# Patient Record
Sex: Female | Born: 1970 | Race: Black or African American | Hispanic: No | Marital: Single | Smoking: Current every day smoker
Health system: Southern US, Community
[De-identification: ages and names within clinical notes are randomized; demographics above are authoritative.]

## PROBLEM LIST (undated history)

## (undated) DIAGNOSIS — J40 Bronchitis, not specified as acute or chronic: Secondary | ICD-10-CM

## (undated) DIAGNOSIS — I10 Essential (primary) hypertension: Secondary | ICD-10-CM

---

## 1999-06-01 ENCOUNTER — Emergency Department (HOSPITAL_COMMUNITY): Admission: EM | Admit: 1999-06-01 | Discharge: 1999-06-01 | Payer: Self-pay | Admitting: Emergency Medicine

## 1999-09-01 ENCOUNTER — Ambulatory Visit (HOSPITAL_COMMUNITY): Admission: RE | Admit: 1999-09-01 | Discharge: 1999-09-01 | Payer: Self-pay | Admitting: Internal Medicine

## 1999-09-01 ENCOUNTER — Encounter: Payer: Self-pay | Admitting: Internal Medicine

## 1999-10-27 ENCOUNTER — Emergency Department (HOSPITAL_COMMUNITY): Admission: EM | Admit: 1999-10-27 | Discharge: 1999-10-27 | Payer: Self-pay | Admitting: *Deleted

## 2004-08-08 ENCOUNTER — Emergency Department (HOSPITAL_COMMUNITY): Admission: EM | Admit: 2004-08-08 | Discharge: 2004-08-08 | Payer: Self-pay | Admitting: Emergency Medicine

## 2005-10-17 ENCOUNTER — Emergency Department (HOSPITAL_COMMUNITY): Admission: EM | Admit: 2005-10-17 | Discharge: 2005-10-17 | Payer: Self-pay | Admitting: Emergency Medicine

## 2007-12-08 ENCOUNTER — Emergency Department (HOSPITAL_COMMUNITY): Admission: EM | Admit: 2007-12-08 | Discharge: 2007-12-08 | Payer: Self-pay | Admitting: Emergency Medicine

## 2012-03-11 ENCOUNTER — Encounter (HOSPITAL_COMMUNITY): Payer: Self-pay | Admitting: Emergency Medicine

## 2012-03-11 ENCOUNTER — Emergency Department (HOSPITAL_COMMUNITY)
Admission: EM | Admit: 2012-03-11 | Discharge: 2012-03-11 | Disposition: A | Discharge status: Home / Self Care | Payer: Self-pay | Attending: Emergency Medicine | Admitting: Emergency Medicine

## 2012-03-11 DIAGNOSIS — I10 Essential (primary) hypertension: Secondary | ICD-10-CM | POA: Insufficient documentation

## 2012-03-11 DIAGNOSIS — F17200 Nicotine dependence, unspecified, uncomplicated: Secondary | ICD-10-CM | POA: Insufficient documentation

## 2012-03-11 DIAGNOSIS — T169XXA Foreign body in ear, unspecified ear, initial encounter: Secondary | ICD-10-CM | POA: Insufficient documentation

## 2012-03-11 DIAGNOSIS — H61899 Other specified disorders of external ear, unspecified ear: Secondary | ICD-10-CM

## 2012-03-11 DIAGNOSIS — IMO0002 Reserved for concepts with insufficient information to code with codable children: Secondary | ICD-10-CM | POA: Insufficient documentation

## 2012-03-11 HISTORY — DX: Essential (primary) hypertension: I10

## 2012-03-11 HISTORY — DX: Bronchitis, not specified as acute or chronic: J40

## 2012-03-11 MED ORDER — LIDOCAINE VISCOUS 2 % MT SOLN: 20.0000 mL | Freq: Once | OROMUCOSAL | Status: DC

## 2012-03-11 MED ORDER — LIDOCAINE HCL 4 % EX SOLN
Freq: Once | CUTANEOUS | Status: AC
  Administered 2012-03-11: 1 mL via TOPICAL
  Filled 2012-03-11 (×2): qty 50

## 2012-03-11 NOTE — ED Provider Notes (Signed)
History     CSN: [id number]  Arrival date & time 03/11/12  4098   First MD Initiated Contact with Patient 03/11/12 [phone]      Chief Complaint  Patient presents with  . Foreign Body in Ear    (Consider location/radiation/quality/duration/timing/severity/associated sxs/prior treatment) HPI. Patient presents to the emergency department with foreign body in the right ear.  Patient, states, feels, like a bug in her right ear.  Patient, states, that she woke up this morning, with that sensation.  Patient denies any decreased hearing or hearing loss.  Patient denies any drainage from the ear or fever.  Patient had attempt anything prior to arrival, for removal of foreign body. Past Medical History  Diagnosis Date  . Hypertension   . Bronchitis     History reviewed. No pertinent past surgical history.  No family history on file.  History  Substance Use Topics  . Smoking status: Current Everyday Smoker -- 0.2 packs/day  . Smokeless tobacco: Never Used  . Alcohol Use: No    OB History    Grav Para Term Preterm Abortions TAB SAB Ect Mult Living                  Review of Systems All other systems negative except as documented in the HPI. All pertinent positives and negatives as reviewed in the HPI.  Allergies  Review of patient's allergies indicates no known allergies.  Home Medications   Current Outpatient Rx  Name Route Sig Dispense Refill  . ASPIRIN-ACETAMINOPHEN-CAFFEINE 250-250-65 MG PO TABS Oral Take 2 tablets by mouth every 6 (six) hours as needed. migraine      BP 166/109  Pulse 92  Temp 98.7 F (37.1 C) (Oral)  SpO2 98%  LMP 02/21/2012  Physical Exam  Nursing note and vitals reviewed. Constitutional: She appears well-developed and well-nourished. No distress.  HENT:  Ears:    ED Course  Procedures (including critical care time)   Nurse rinsed the ear and I re-examined the patient and I can't see any bug or FB. The small piece of wax was removed. I  do not feel that any further work on this needs to occur at this time. The patient states that she does not feel the sensation that there is a bug in her ear any longer.  MDM          Carlyle Dolly, PA-C 03/11/12 1150

## 2012-03-11 NOTE — ED Provider Notes (Signed)
Medical screening examination/treatment/procedure(s) were performed by non-physician practitioner and as supervising physician I was immediately available for consultation/collaboration.  Marlen Koman, MD 03/11/12 1519 

## 2012-03-11 NOTE — ED Notes (Signed)
Pt presents w/ "bug in right ear" woke her up during the morning, can still fill it moving around inside. Pt w/ Hx of hypertension not on meds.

## 2013-09-25 ENCOUNTER — Encounter (HOSPITAL_COMMUNITY): Payer: Self-pay | Admitting: Emergency Medicine

## 2013-09-25 ENCOUNTER — Emergency Department (HOSPITAL_COMMUNITY)
Admission: EM | Admit: 2013-09-25 | Discharge: 2013-09-25 | Disposition: A | Discharge status: Home / Self Care | Payer: Self-pay | Attending: Emergency Medicine | Admitting: Emergency Medicine

## 2013-09-25 DIAGNOSIS — F17200 Nicotine dependence, unspecified, uncomplicated: Secondary | ICD-10-CM | POA: Insufficient documentation

## 2013-09-25 DIAGNOSIS — J209 Acute bronchitis, unspecified: Secondary | ICD-10-CM | POA: Insufficient documentation

## 2013-09-25 DIAGNOSIS — Z79899 Other long term (current) drug therapy: Secondary | ICD-10-CM | POA: Insufficient documentation

## 2013-09-25 DIAGNOSIS — J40 Bronchitis, not specified as acute or chronic: Secondary | ICD-10-CM

## 2013-09-25 DIAGNOSIS — I10 Essential (primary) hypertension: Secondary | ICD-10-CM | POA: Insufficient documentation

## 2013-09-25 MED ORDER — AZITHROMYCIN 250 MG PO TABS: 250.0000 mg | ORAL_TABLET | Freq: Every day | ORAL | Status: DC

## 2013-09-25 MED ORDER — HYDROCOD POLST-CHLORPHEN POLST 10-8 MG/5ML PO LQCR: 5.0000 mL | Freq: Two times a day (BID) | ORAL | Status: DC

## 2013-09-25 MED ORDER — ALBUTEROL SULFATE HFA 108 (90 BASE) MCG/ACT IN AERS: 1.0000 | INHALATION_SPRAY | Freq: Four times a day (QID) | RESPIRATORY_TRACT | Status: DC | PRN

## 2013-09-25 NOTE — ED Provider Notes (Addendum)
CSN: [id number]     Arrival date & time 09/25/13  [id number] History   First MD Initiated Contact with Patient 09/25/13 0703     Chief Complaint  Patient presents with  . Cough     HPI  Patient presents with a cough for last 5 days. She coughing the point that she cannot work. She starts to cough and has a staccato cough that goes over several minutes. She gags at times but has not had posttussive emesis. Is not around any ill exposures to her knowledge. Has not been febrile but does have diffuse bodyaches.  No GI complaints. She is a smoker. No history of asthma or lung disease.  Past Medical History  Diagnosis Date  . Hypertension   . Bronchitis    History reviewed. No pertinent past surgical history. History reviewed. No pertinent family history. History  Substance Use Topics  . Smoking status: Current Every Day Smoker -- 0.25 packs/day  . Smokeless tobacco: Never Used  . Alcohol Use: No   OB History   Grav Para Term Preterm Abortions TAB SAB Ect Mult Living                 Review of Systems  Constitutional: Negative for fever, chills, diaphoresis, appetite change and fatigue.  HENT: Negative for mouth sores, sore throat and trouble swallowing.   Eyes: Negative for visual disturbance.  Respiratory: Positive for cough and wheezing. Negative for chest tightness and shortness of breath.   Cardiovascular: Negative for chest pain.  Gastrointestinal: Negative for nausea, vomiting, abdominal pain, diarrhea and abdominal distention.  Endocrine: Negative for polydipsia, polyphagia and polyuria.  Genitourinary: Negative for dysuria, frequency and hematuria.  Musculoskeletal: Negative for gait problem.  Skin: Negative for color change, pallor and rash.  Neurological: Negative for dizziness, syncope, light-headedness and headaches.  Hematological: Does not bruise/bleed easily.  Psychiatric/Behavioral: Negative for behavioral problems and confusion.      Allergies  Review of  patient's allergies indicates no known allergies.  Home Medications   Current Outpatient Rx  Name  Route  Sig  Dispense  Refill  . albuterol (PROVENTIL HFA;VENTOLIN HFA) 108 (90 BASE) MCG/ACT inhaler   Inhalation   Inhale 1-2 puffs into the lungs every 6 (six) hours as needed for wheezing.   1 Inhaler   0   . aspirin-acetaminophen-caffeine (EXCEDRIN MIGRAINE) 250-250-65 MG per tablet   Oral   Take 2 tablets by mouth every 6 (six) hours as needed. migraine         . azithromycin (ZITHROMAX Z-PAK) 250 MG tablet   Oral   Take 1 tablet (250 mg total) by mouth daily. As directed   6 tablet   0   . chlorpheniramine-HYDROcodone (TUSSIONEX PENNKINETIC ER) 10-8 MG/5ML LQCR   Oral   Take 5 mLs by mouth every 12 (twelve) hours.   60 mL   0    BP 201/118  Pulse 107  Temp(Src) 98.4 F (36.9 C) (Oral)  Resp 18  SpO2 100% Physical Exam  Constitutional: She is oriented to person, place, and time. She appears well-developed and well-nourished. No distress.  HENT:  Head: Normocephalic.  Eyes: Conjunctivae are normal. Pupils are equal, round, and reactive to light. No scleral icterus.  Neck: Normal range of motion. Neck supple. No thyromegaly present.  Cardiovascular: Normal rate and regular rhythm.  Exam reveals no gallop and no friction rub.   No murmur heard. Pulmonary/Chest: Effort normal and breath sounds normal. No respiratory distress. She has no wheezes.  She has no rales.  No focal diminished breath sounds are asymmetric breath sounds. Wheezing with cough none at rest. No prolongation.  Abdominal: Soft. Bowel sounds are normal. She exhibits no distension. There is no tenderness. There is no rebound.  Musculoskeletal: Normal range of motion.  Neurological: She is alert and oriented to person, place, and time.  Skin: Skin is warm and dry. No rash noted.  Psychiatric: She has a normal mood and affect. Her behavior is normal.    ED Course  Procedures (including critical care  time) Labs Review Labs Reviewed - No data to display Imaging Review No results found.   EKG Interpretation None      MDM   Final diagnoses:  Bronchitis    Normal pulmonary exam. Does have bronchospasm with cough. Afebrile. 100% saturations at the bedside. Plan will be treated with albuterol, Zithromax, Tussionex. Stop smoking.    Rolland Porter, MD 09/25/13 4098  Rolland Porter, MD 09/25/13 [phone]

## 2013-09-25 NOTE — Discharge Instructions (Signed)
Bronchitis °Bronchitis is swelling (inflammation) of the air tubes leading to your lungs (bronchi). This causes mucus and a cough. If the swelling gets bad, you may have trouble breathing. °HOME CARE  °· Rest. °· Drink enough fluids to keep your pee (urine) clear or pale yellow (unless you have a condition where you have to watch how much you drink). °· Only take medicine as told by your doctor. If you were given antibiotic medicines, finish them even if you start to feel better. °· Avoid smoke, irritating chemicals, and strong smells. These make the problem worse. Quit smoking if you smoke. This helps your lungs heal faster. °· Use a cool mist humidifier. Change the water in the humidifier every day. You can also sit in the bathroom with hot shower running for 5 10 minutes. Keep the door closed. °· See your health care provider as told. °· Wash your hands often. °GET HELP IF: °Your problems do not get better after 1 week. °GET HELP RIGHT AWAY IF:  °· Your fever gets worse. °· You have chills. °· Your chest hurts. °· Your problems breathing get worse. °· You have blood in your mucus. °· You pass out (faint). °· You feel lightheaded. °· You have a bad headache. °· You throw up (vomit) again and again. °MAKE SURE YOU: °· Understand these instructions. °· Will watch your condition. °· Will get help right away if you are not doing well or get worse. °Document Released: 12/21/2007 Document Revised: 04/24/2013 Document Reviewed: 02/26/2013 °[employer]® Patient Information ©2014 [employer]. ° °

## 2013-09-25 NOTE — ED Notes (Signed)
Pt presents to ED with c/o persistent cough.  Pt reports that she has been having"whooping cough" x 1 week now--- pt reports taking Mucinex without relief.  Pt states that her cough is occasionally productive of "very little" phlegm.  Pt denies shortness of breath or any difficulty of breathing.

## 2014-09-12 ENCOUNTER — Emergency Department (HOSPITAL_COMMUNITY)
Admission: EM | Admit: 2014-09-12 | Discharge: 2014-09-13 | Disposition: A | Discharge status: Home / Self Care | Payer: BLUE CROSS/BLUE SHIELD | Attending: Emergency Medicine | Admitting: Emergency Medicine

## 2014-09-12 ENCOUNTER — Encounter (HOSPITAL_COMMUNITY): Payer: Self-pay | Admitting: Emergency Medicine

## 2014-09-12 DIAGNOSIS — R51 Headache: Secondary | ICD-10-CM | POA: Diagnosis not present

## 2014-09-12 DIAGNOSIS — E876 Hypokalemia: Secondary | ICD-10-CM | POA: Diagnosis not present

## 2014-09-12 DIAGNOSIS — I10 Essential (primary) hypertension: Secondary | ICD-10-CM | POA: Diagnosis present

## 2014-09-12 DIAGNOSIS — Z8709 Personal history of other diseases of the respiratory system: Secondary | ICD-10-CM | POA: Insufficient documentation

## 2014-09-12 DIAGNOSIS — Z720 Tobacco use: Secondary | ICD-10-CM | POA: Insufficient documentation

## 2014-09-12 DIAGNOSIS — Z79899 Other long term (current) drug therapy: Secondary | ICD-10-CM | POA: Insufficient documentation

## 2014-09-12 DIAGNOSIS — F17200 Nicotine dependence, unspecified, uncomplicated: Secondary | ICD-10-CM

## 2014-09-12 LAB — I-STAT CHEM 8, ED
BUN: 17 mg/dL (ref 6–23)
CALCIUM ION: 1.09 mmol/L — AB (ref 1.12–1.23)
CREATININE: 0.9 mg/dL (ref 0.50–1.10)
Chloride: 103 mmol/L (ref 96–112)
Glucose, Bld: 127 mg/dL — ABNORMAL HIGH (ref 70–99)
HCT: 45 % (ref 36.0–46.0)
Hemoglobin: 15.3 g/dL — ABNORMAL HIGH (ref 12.0–15.0)
Potassium: 3 mmol/L — ABNORMAL LOW (ref 3.5–5.1)
Sodium: 140 mmol/L (ref 135–145)
TCO2: 20 mmol/L (ref 0–100)

## 2014-09-12 LAB — I-STAT BETA HCG BLOOD, ED (MC, WL, AP ONLY)

## 2014-09-12 MED ORDER — POTASSIUM CHLORIDE CRYS ER 20 MEQ PO TBCR
40.0000 meq | EXTENDED_RELEASE_TABLET | Freq: Once | ORAL | Status: AC
  Administered 2014-09-12: 40 meq via ORAL
  Filled 2014-09-12: qty 2

## 2014-09-12 MED ORDER — LISINOPRIL-HYDROCHLOROTHIAZIDE 10-12.5 MG PO TABS: 1.0000 | ORAL_TABLET | Freq: Every day | ORAL | Status: DC

## 2014-09-12 NOTE — ED Notes (Signed)
Pt c/o HA and hypertension for several days. Pt states she has not been taking Lisinopril because she does not feel they were working

## 2014-09-12 NOTE — ED Provider Notes (Signed)
CSN: [id number]     Arrival date & time 09/12/14  2108 History   First MD Initiated Contact with Patient 09/12/14 2216     Chief Complaint  Patient presents with  . Hypertension  . Headache     (Consider location/radiation/quality/duration/timing/severity/associated sxs/prior Treatment) HPI  Diane Hunter is a 44 y.o. female complaining of elevated blood pressure which she noticed after checking it at [facility] earlier in the evening, systolic was 210, in the ED at 201. Patient also notes a right. Orbital and maxillary burning sensation which she has related to a sinusitis onset 3 days ago. She rates it as mild, 1 out of 10, leaning forward exacerbates. She denies fever, chills, change in vision, dysarthria, ataxia, cervicalgia, chest pain, shortness of breath, abdominal pain, change in bowel or bladder habits.  She states she self DC'd lisinopril 40 mg because she felt like it wasn't helping her, she has not had her high blood pressure medication in 4 weeks.  Past Medical History  Diagnosis Date  . Hypertension   . Bronchitis    History reviewed. No pertinent past surgical history. No family history on file. History  Substance Use Topics  . Smoking status: Current Every Day Smoker -- 0.25 packs/day  . Smokeless tobacco: Never Used  . Alcohol Use: No   OB History    No data available     Review of Systems  10 systems reviewed and found to be negative, except as noted in the HPI.   Allergies  Pork-derived products  Home Medications   Prior to Admission medications   Medication Sig Start Date End Date Taking? Authorizing Provider  aspirin-acetaminophen-caffeine (EXCEDRIN MIGRAINE) [phone] MG per tablet Take 2 tablets by mouth every 6 (six) hours as needed. migraine   Yes Historical Provider, MD  lisinopril (PRINIVIL,ZESTRIL) 10 MG tablet Take 10 mg by mouth daily.   Yes Historical Provider, MD  pseudoephedrine (SUDAFED) 30 MG tablet Take 30 mg by mouth every 4 (four) hours  as needed for congestion.   Yes Historical Provider, MD  albuterol (PROVENTIL HFA;VENTOLIN HFA) 108 (90 BASE) MCG/ACT inhaler Inhale 1-2 puffs into the lungs every 6 (six) hours as needed for wheezing. Patient not taking: Reported on 09/12/2014 09/25/13   Rolland Porter, MD  azithromycin (ZITHROMAX Z-PAK) 250 MG tablet Take 1 tablet (250 mg total) by mouth daily. As directed Patient not taking: Reported on 09/12/2014 09/25/13   Rolland Porter, MD  chlorpheniramine-HYDROcodone [facility]) 10-8 MG/5ML [facility] Take 5 mLs by mouth every 12 (twelve) hours. Patient not taking: Reported on 09/12/2014 09/25/13   Rolland Porter, MD  lisinopril-hydrochlorothiazide (PRINZIDE,ZESTORETIC) 10-12.5 MG per tablet Take 1 tablet by mouth daily. 09/12/14   Jariyah Hackley, PA-C   BP 171/109 mmHg  Pulse 74  Temp(Src) 98.3 F (36.8 C) (Oral)  Resp 20  SpO2 100%  LMP 09/03/2014 Physical Exam  Constitutional: She is oriented to person, place, and time. She appears well-developed and well-nourished. No distress.  HENT:  Head: Normocephalic and atraumatic.  Mouth/Throat: Oropharynx is clear and moist.  No drooling or stridor. Posterior pharynx mildly erythematous no significant tonsillar hypertrophy. No exudate. Soft palate rises symmetrically. No TTP or induration under tongue.   No tenderness to palpation of frontal or bilateral maxillary sinuses.  No mucosal edema in the nares.  Bilateral tympanic membranes with normal architecture and good light reflex.    Eyes: Conjunctivae and EOM are normal. Pupils are equal, round, and reactive to light.  Neck: Normal range of motion.  Neck supple.  No drooling or stridor. Posterior pharynx mildly erythematous no significant tonsillar hypertrophy. No exudate. Soft palate rises symmetrically. No TTP or induration under tongue.   No tenderness to palpation of frontal or bilateral maxillary sinuses.  No mucosal edema in the nares.  Bilateral tympanic membranes with normal  architecture and good light reflex.    Cardiovascular: Normal rate, regular rhythm and intact distal pulses.   Pulmonary/Chest: Effort normal and breath sounds normal. No stridor. No respiratory distress. She has no wheezes. She has no rales. She exhibits no tenderness.  Abdominal: Soft. Bowel sounds are normal. She exhibits no distension and no mass. There is no tenderness. There is no rebound and no guarding.  Musculoskeletal: Normal range of motion. She exhibits no edema or tenderness.  Neurological: She is alert and oriented to person, place, and time.  II-Visual fields grossly intact. III/IV/VI-Extraocular movements intact.  Pupils reactive bilaterally. V/VII-Smile symmetric, equal eyebrow raise,  facial sensation intact VIII- Hearing grossly intact IX/X-Normal gag XI-bilateral shoulder shrug XII-midline tongue extension Motor: 5/5 bilaterally with normal tone and bulk Cerebellar: Normal finger-to-nose  and normal heel-to-shin test.   Romberg negative Ambulates with a coordinated gait   Skin: Skin is warm.  Psychiatric: She has a normal mood and affect.  Nursing note and vitals reviewed.   ED Course  Procedures (including critical care time) Labs Review Labs Reviewed  I-STAT CHEM 8, ED - Abnormal; Notable for the following:    Potassium 3.0 (*)    Glucose, Bld 127 (*)    Calcium, Ion 1.09 (*)    Hemoglobin 15.3 (*)    All other components within normal limits  I-STAT BETA HCG BLOOD, ED ([facility], [facility], [facility] ONLY)    Imaging Review No results found.   EKG Interpretation None      MDM   Final diagnoses:  Hypertension, uncontrolled  Tobacco use disorder  Hypokalemia    Filed Vitals:   09/12/14 2133 09/12/14 2325  BP: 201/104 171/109  Pulse: 84 74  Temp: 98.3 F (36.8 C)   TempSrc: Oral   Resp: 20   SpO2: 98% 100%    Medications  potassium chloride SA (K-DUR,KLOR-CON) CR tablet 40 mEq (40 mEq Oral Given 09/12/14 2341)    Diane Hunter is a pleasant 44 y.o.  female presenting with right periorbital headache and elevated blood pressure. Patient self DC'd her lisinopril approximately one month ago. Neuro exam is nonfocal. No indication for neuroimaging, doubt CVA. Had an extensive discussion with the patient on medication compliance and smoking cessation.  Potassium is mildly decreased at 3.0, gave KDur  and encouraged Pt to eat high potassium food  Evaluation does not show pathology that would require ongoing emergent intervention or inpatient treatment. Pt is hemodynamically stable and mentating appropriately. Discussed findings and plan with patient/guardian, who agrees with care plan. All questions answered. Return precautions discussed and outpatient follow up given.   Discharge Medication List as of 09/12/2014 11:29 PM    START taking these medications   Details  lisinopril-hydrochlorothiazide (PRINZIDE,ZESTORETIC) 10-12.5 MG per tablet Take 1 tablet by mouth daily., Starting 09/12/2014, Until Discontinued, Print             Wynetta Emeryicole Cordelro Gautreau, PA-C 09/13/14 [id number]  Linwood DibblesJon Knapp, MD 09/13/14 [phone]

## 2014-09-12 NOTE — Discharge Instructions (Signed)
Use nasal saline (you can try Arm and Hammer Simply Saline) at least 4 times a day, use saline 5-10 minutes before using the fluticasone (flonase) nasal spray  Do not use Afrin (Oxymetazoline)  Rest, wash hands frequently  and drink plenty of water.  You may try counter medication such as Mucinex or Sudafed decongestant.  Do not hesitate to return to the emergency room for any new, worsening or concerning symptoms.  Please obtain primary care using resource guide below. But the minute you were seen in the emergency room and that they will need to obtain records for further outpatient management.      DASH Eating Plan DASH stands for "Dietary Approaches to Stop Hypertension." The DASH eating plan is a healthy eating plan that has been shown to reduce high blood pressure (hypertension). Additional health benefits may include reducing the risk of type 2 diabetes mellitus, heart disease, and stroke. The DASH eating plan may also help with weight loss. WHAT DO I NEED TO KNOW ABOUT THE DASH EATING PLAN? For the DASH eating plan, you will follow these general guidelines:  Choose foods with a percent daily value for sodium of less than 5% (as listed on the food label).  Use salt-free seasonings or herbs instead of table salt or sea salt.  Check with your health care provider or pharmacist before using salt substitutes.  Eat lower-sodium products, often labeled as "lower sodium" or "no salt added."  Eat fresh foods.  Eat more vegetables, fruits, and low-fat dairy products.  Choose whole grains. Look for the word "whole" as the first word in the ingredient list.  Choose fish and skinless chicken or [place] more often than red meat. Limit fish, poultry, and meat to 6 oz (170 g) each day.  Limit sweets, desserts, sugars, and sugary drinks.  Choose heart-healthy fats.  Limit cheese to 1 oz (28 g) per day.  Eat more home-cooked food and less restaurant, buffet, and fast food.  Limit  fried foods.  Cook foods using methods other than frying.  Limit canned vegetables. If you do use them, rinse them well to decrease the sodium.  When eating at a restaurant, ask that your food be prepared with less salt, or no salt if possible. WHAT FOODS CAN I EAT? Seek help from a dietitian for individual calorie needs. Grains Whole grain or whole wheat bread. Brown rice. Whole grain or whole wheat pasta. Quinoa, bulgur, and whole grain cereals. Low-sodium cereals. Corn or whole wheat flour tortillas. Whole grain cornbread. Whole grain crackers. Low-sodium crackers. Vegetables Fresh or frozen vegetables (raw, steamed, roasted, or grilled). Low-sodium or reduced-sodium tomato and vegetable juices. Low-sodium or reduced-sodium tomato sauce and paste. Low-sodium or reduced-sodium canned vegetables.  Fruits All fresh, canned (in natural juice), or frozen fruits. Meat and Other Protein Products Ground beef (85% or leaner), grass-fed beef, or beef trimmed of fat. Skinless chicken or [place]. Ground chicken or [place]. Pork trimmed of fat. All fish and seafood. Eggs. Dried beans, peas, or lentils. Unsalted nuts and seeds. Unsalted canned beans. Dairy Low-fat dairy products, such as skim or 1% milk, 2% or reduced-fat cheeses, low-fat ricotta or cottage cheese, or plain low-fat yogurt. Low-sodium or reduced-sodium cheeses. Fats and Oils Tub margarines without trans fats. Light or reduced-fat mayonnaise and salad dressings (reduced sodium). Avocado. Safflower, olive, or canola oils. Natural peanut or almond butter. Other Unsalted popcorn and pretzels. The items listed above may not be a complete list of recommended foods or beverages. Contact your  dietitian for more options. WHAT FOODS ARE NOT RECOMMENDED? Grains White bread. White pasta. White rice. Refined cornbread. Bagels and croissants. Crackers that contain trans fat. Vegetables Creamed or fried vegetables. Vegetables in a cheese sauce.  Regular canned vegetables. Regular canned tomato sauce and paste. Regular tomato and vegetable juices. Fruits Dried fruits. Canned fruit in light or heavy syrup. Fruit juice. Meat and Other Protein Products Fatty cuts of meat. Ribs, chicken wings, bacon, sausage, bologna, salami, chitterlings, fatback, hot dogs, bratwurst, and packaged luncheon meats. Salted nuts and seeds. Canned beans with salt. Dairy Whole or 2% milk, cream, half-and-half, and cream cheese. Whole-fat or sweetened yogurt. Full-fat cheeses or blue cheese. Nondairy creamers and whipped toppings. Processed cheese, cheese spreads, or cheese curds. Condiments Onion and garlic salt, seasoned salt, table salt, and sea salt. Canned and packaged gravies. Worcestershire sauce. Tartar sauce. Barbecue sauce. Teriyaki sauce. Soy sauce, including reduced sodium. Steak sauce. Fish sauce. Oyster sauce. Cocktail sauce. Horseradish. Ketchup and mustard. Meat flavorings and tenderizers. Bouillon cubes. Hot sauce. Tabasco sauce. Marinades. Taco seasonings. Relishes. Fats and Oils Butter, stick margarine, lard, shortening, ghee, and bacon fat. Coconut, palm kernel, or palm oils. Regular salad dressings. Other Pickles and olives. Salted popcorn and pretzels. The items listed above may not be a complete list of foods and beverages to avoid. Contact your dietitian for more information. WHERE CAN I FIND MORE INFORMATION? National Heart, Lung, and Blood Institute: CablePromo.itwww.nhlbi.nih.gov/health/health-topics/topics/dash/ Document Released: 06/23/2011 Document Revised: 11/18/2013 Document Reviewed: 05/08/2013 [facility] Patient Information 2015 [employer], [place]. This information is not intended to replace advice given to you by your health care provider. Make sure you discuss any questions you have with your health care provider. Emergency Department Resource Guide 1) Find a Doctor and Pay Out of Pocket Although you won't have to find out who is covered by your  insurance plan, it is a good idea to ask around and get recommendations. You will then need to call the office and see if the doctor you have chosen will accept you as a new patient and what types of options they offer for patients who are self-pay. Some doctors offer discounts or will set up payment plans for their patients who do not have insurance, but you will need to ask so you aren't surprised when you get to your appointment.  2) Contact Your Local Health Department Not all health departments have doctors that can see patients for sick visits, but many do, so it is worth a call to see if yours does. If you don't know where your local health department is, you can check in your phone book. The CDC also has a tool to help you locate your state's health department, and many state websites also have listings of all of their local health departments.  3) Find a Walk-in Clinic If your illness is not likely to be very severe or complicated, you may want to try a walk in clinic. These are popping up all over the country in pharmacies, drugstores, and shopping centers. They're usually staffed by nurse practitioners or physician assistants that have been trained to treat common illnesses and complaints. They're usually fairly quick and inexpensive. However, if you have serious medical issues or chronic medical problems, these are probably not your best option.  No Primary Care Doctor: - Call Health Connect at  [phone] - they can help you locate a primary care doctor that  accepts your insurance, provides certain services, etc. - Physician Referral Service- [phone]  Chronic Pain  Problems: Organization         Address  Phone   Notes  [facility]  [phone] Patients need to be referred by their primary care doctor.   Medication Assistance: Organization         Address  Phone   Notes  [facility] Medication [facility] [address] [place] [place], [place] [address] [phone] --Must be a resident of [facility] -- Must have NO insurance coverage whatsoever (no Medicaid/ Medicare, etc.) -- The pt. MUST have a primary care doctor that directs their care regularly and follows them in the community   [facility]ist  [phone]   [employer]  [phone]    Agencies that provide inexpensive medical care: Organization         Address  Phone   Notes  [facility]  [phone]   [facility]    [phone]   [facility] [address] [place], [place] [address] [phone]   [facility] [address] [place], [place] [phone]   Planned Parenthood    [phone]   [facility]    [phone]   Community [facility] and [facility]  [address], [place] Phone:  [phone], Fax:  [phone] Hours of Operation:  9 am - 6 pm, M-F.  Also accepts Medicaid/Medicare and self-pay.  [facility]  [address], [place] Phone: [phone], Fax: [phone]. Hours of Operation:  8:30 am - 5:30 pm, M-F.  Also accepts Medicaid and self-pay.  [facility] [place] [address], [place] Phone: [phone]   [facility] [address] Natasha Bence [place], [place] [phone], Ext. [phone] Mondays & Thursdays: 7-9 AM.  First 15 patients are seen on a first come, first serve basis.    Medicaid-accepting [facility] Providers:  Organization         Address  Phone   Notes  [facility] [address], [place] [phone] Also accepts self-pay patients.  [facility] [address] Laurell Josephs [place], [place]  [phone]   [facility] [address], [place] [phone]   [facility] [address] [place] [phone]   Renaye Rakers [address], [place]   [phone] Only accepts [place] Access [place] patients after they have their name applied to their card.   Self-Pay (no insurance) in [facility]:  Organization         Address  Phone   Notes  Sickle Cell Patients, [facility] [address] [place], [place] [phone]   [facility] [address] [place], [place] [phone]   [facility]  [address], [place] [phone]   [facility]/Dr. Osei-Bonsu  [address] [place] or [address], [place] [phone] Phone number for both [place] and [place] locations is the same.  [facility]gent Medical and [facility] [address] [place] [phone]   [facility] [address], [place] or [address] [phone]   [facility] [address], [place] [phone], phone; [phone], fax Sees patients 1st  and 3rd Saturday of every month.  Must not qualify for public or private insurance (i.e. Medicaid, Medicare, [place] Health Choice, Veterans' Benefits)  Household income should be no more than 200% of the poverty level The clinic cannot treat you if you are pregnant or think you are pregnant  Sexually transmitted diseases are not treated at the clinic.    Dental Care: Organization         Address  Phone  Notes  [facility] of [facility] [address] [place], [place] [phone] Accepts children up to age 37 who are enrolled in [place] or [place] Health Choice; pregnant women with a Medicaid card; and children who have applied for Medicaid or [place] Health Choice, but were declined, whose parents can pay a reduced fee at time of service.  [facility] of [facility]  [address], [place] [phone] Accepts children up to age 10 who are enrolled  in [place] or [place] Health Choice; pregnant women with a Medicaid card; and children who have applied for Medicaid or [place] Health Choice, but were declined, whose parents can pay a reduced fee at time of service.  [facility]  [address] [place], [place] [phone] Patients are seen by appointment only. Walk-ins are not accepted. [facility] will see patients 83 years of age and older. Monday - Tuesday (8am-5pm) Most Wednesdays (8:30-5pm) $30 per visit, cash only  [facility]  [address], [facility] [phone] Patients are seen by appointment only. Walk-ins are not accepted. [facility] will see patients 47 years of age and older. One Wednesday Evening (Monthly: Volunteer Based).  $30 per visit, cash only  [employer]  [phone] for adults; Children under age 53, call Graduate Pediatric Dentistry at [phone]. Children aged 103-14, please call [phone] to request a pediatric application.  Dental services are provided in all areas of dental care including fillings, crowns and bridges, complete and partial dentures, implants, gum treatment, root canals, and extractions. Preventive care is also provided. Treatment is provided to both adults and children. Patients are selected via a lottery and there is often a waiting list.   [facility] [address], [place]  [phone] [email or web address]   [facility] [address] [place], [place] [phone], Ext. [phone] Second and Fourth Thursday of each month, opens at 6:30 AM; Clinic ends at 9 AM.  Patients are seen on a first-come first-served basis, and a limited number are seen during each clinic.   [facility]  [address] Ether [place], [place] [phone]   Eligibility Requirements You must have lived in [place], [place], or [place] counties for at least the last three months.   You cannot be  eligible for state or federal sponsored [place], including [employer], [place], or [employer].   You generally cannot be eligible for healthcare insurance through your employer.    How to apply: Eligibility screenings are held every Tuesday and Wednesday afternoon from 1:00 pm until 4:00 pm. You do not need an appointment for the interview!  [facility] [address] [place], [place] [phone]   [facility] Health Department  [phone]   [facility] Health Department  [phone]   [facility] Health Department  [phone]    Behavioral Health Resources in the Community: Intensive Outpatient Programs Organization  Address  Phone  Notes  [facility] [address] [place], [place] [phone]   [facility] Outpatient [address] [place], [place] [phone]   [facility] [address], [place], [place]  [phone]   [facility] [address],  [place], [place] [phone] or [phone]   Substance Abuse Resources Organization         Address  Phone  Notes  [facility]  [phone]   [facility]  [phone]   [facility]  [phone]   Floydene Flock  [phone]   Residential & Outpatient Substance Abuse Program  [phone]   Psychological Services Organization         Address  Phone  Notes  [facility] Health  [phone]   [facility] Services  [phone]   [facility] [address], [place] [phone] or [phone]    Mobile Crisis Teams Organization         Address  Phone  Notes  [facility], Mobile Crisis Care Unit  [phone]   [facility]  [address] [place], [place] [phone]   Doristine Locks [address] [place] [address] [place] [phone]    Self-Help/Support Groups Organization          Address  Phone             Notes  [facility] [place] - variety of support groups  336- [phone] Call for more information  Narcotics Anonymous (NA), [facility] [address], [employer] [place]  2 meetings at this location   Statistician         Address  Phone  Notes  [facility] [address],    [place]  [phone]   [facility]  [address] [place] [address], [place], [place] [phone]   [facility] [address] [place], [place] [phone] Admissions: 8am-3pm M-F  [facility] [address]    [place], [place] [phone]   [facility] [address] [place], [place], [place] [phone]   [facility] [address],  [place], [place] [phone]   [facility] - Intensive Outpatient [address] Laurell Josephs [address], [place], [place] [phone]   [facility] ([facility] [address] [place]  [place], [place] [phone] or [phone]   [facility] ([facility]) [address], [place], [place] [phone] Accepts Medicaid  [facility] [address],  [place] [phone] Substance Abuse/Addiction Treatment   [facility] Organization         Address  Phone  Notes  [facility]  [phone]   Angie Fava, PhD [address] Ervin Knack [place], [place]   [phone] or [phone]   [facility]al   [address] [place], [place] [phone]   [facility] [address] [place], [place] [phone] Insurance/Medicaid/sponsorship through [facility] and Families [address]                                    [place], [place] [phone] Therapy/tele-psych/case  [facility] [address] [place] [phone]    Dr. Lolly Mustache  [phone]   [facility]  [facility] Dept. 1) [address] [place] 2) [address], [place] 3)  [address], [place] [phone]  [phone]   [facility] Child Abuse Hotline 865-485-0965 or [phone] (After Hours)

## 2014-12-23 ENCOUNTER — Encounter (HOSPITAL_COMMUNITY): Payer: Self-pay | Admitting: Emergency Medicine

## 2014-12-23 ENCOUNTER — Emergency Department (HOSPITAL_COMMUNITY)
Admission: EM | Admit: 2014-12-23 | Discharge: 2014-12-23 | Disposition: A | Discharge status: Home / Self Care | Payer: BLUE CROSS/BLUE SHIELD | Attending: Emergency Medicine | Admitting: Emergency Medicine

## 2014-12-23 DIAGNOSIS — Z720 Tobacco use: Secondary | ICD-10-CM | POA: Diagnosis not present

## 2014-12-23 DIAGNOSIS — S8992XA Unspecified injury of left lower leg, initial encounter: Secondary | ICD-10-CM | POA: Diagnosis not present

## 2014-12-23 DIAGNOSIS — Z79899 Other long term (current) drug therapy: Secondary | ICD-10-CM | POA: Insufficient documentation

## 2014-12-23 DIAGNOSIS — Z792 Long term (current) use of antibiotics: Secondary | ICD-10-CM | POA: Insufficient documentation

## 2014-12-23 DIAGNOSIS — I10 Essential (primary) hypertension: Secondary | ICD-10-CM | POA: Insufficient documentation

## 2014-12-23 DIAGNOSIS — Y9389 Activity, other specified: Secondary | ICD-10-CM | POA: Diagnosis not present

## 2014-12-23 DIAGNOSIS — S3992XA Unspecified injury of lower back, initial encounter: Secondary | ICD-10-CM | POA: Diagnosis not present

## 2014-12-23 DIAGNOSIS — M791 Myalgia, unspecified site: Secondary | ICD-10-CM

## 2014-12-23 DIAGNOSIS — Z8709 Personal history of other diseases of the respiratory system: Secondary | ICD-10-CM | POA: Diagnosis not present

## 2014-12-23 DIAGNOSIS — Y998 Other external cause status: Secondary | ICD-10-CM | POA: Insufficient documentation

## 2014-12-23 DIAGNOSIS — Y92410 Unspecified street and highway as the place of occurrence of the external cause: Secondary | ICD-10-CM | POA: Diagnosis not present

## 2014-12-23 DIAGNOSIS — S199XXA Unspecified injury of neck, initial encounter: Secondary | ICD-10-CM | POA: Diagnosis present

## 2014-12-23 MED ORDER — METHOCARBAMOL 500 MG PO TABS: 500.0000 mg | ORAL_TABLET | Freq: Two times a day (BID) | ORAL | Status: DC

## 2014-12-23 MED ORDER — NAPROXEN 500 MG PO TABS: 500.0000 mg | ORAL_TABLET | Freq: Two times a day (BID) | ORAL | Status: DC

## 2014-12-23 MED ORDER — HYDROCODONE-ACETAMINOPHEN 5-325 MG PO TABS: 1.0000 | ORAL_TABLET | ORAL | Status: DC | PRN
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
## 2014-12-23 NOTE — Discharge Instructions (Signed)
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
## 2015-02-17 ENCOUNTER — Encounter: Payer: Self-pay | Admitting: Medical

## 2015-02-17 ENCOUNTER — Ambulatory Visit (INDEPENDENT_AMBULATORY_CARE_PROVIDER_SITE_OTHER): Payer: BLUE CROSS/BLUE SHIELD | Admitting: Medical

## 2015-02-17 VITALS — BP 190/130 | HR 83 | Temp 98.6°F | Resp 15 | Wt 180.0 lb

## 2015-02-17 DIAGNOSIS — I10 Essential (primary) hypertension: Secondary | ICD-10-CM

## 2015-02-17 DIAGNOSIS — Z720 Tobacco use: Secondary | ICD-10-CM | POA: Diagnosis not present

## 2015-02-17 DIAGNOSIS — F17200 Nicotine dependence, unspecified, uncomplicated: Secondary | ICD-10-CM

## 2015-02-17 MED ORDER — LISINOPRIL-HYDROCHLOROTHIAZIDE 20-12.5 MG PO TABS: 1.0000 | ORAL_TABLET | Freq: Every day | ORAL | Status: DC

## 2015-02-17 MED ORDER — AMLODIPINE BESYLATE 5 MG PO TABS: 5.0000 mg | ORAL_TABLET | Freq: Every day | ORAL | Status: DC

## 2015-02-17 NOTE — Progress Notes (Signed)
Subjective: Here as a new patient today.  She notes hx/o high blood pressure for several years early 30s, however has not always been compliant with medication.   Prior medical care includes other PCM and emergency dept.   She denies any particular symptoms other than lately having a lot of headaches.  denies chest pain, SOB, edema, vision changes.   Uses no particular diet discretion.  exercising some.  She is a smoker.  She was up to 230 lb at one point and has been able to lose weight.  Prior medications include HCTZ, didn't tolerate Benicar, currently on Lisinopril HCT.  No other aggravating or relieving factors. No other complaint.  Past Medical History  Diagnosis Date  . Hypertension   . Bronchitis    ROS as in subjective  Objective: BP 190/130 mmHg  Pulse 83  Temp(Src) 98.6 F (37 C) (Oral)  Resp 15  Wt 180 lb (81.647 kg)  General appearance: alert, no distress, WD/WN, AA female Neck: supple, no lymphadenopathy, no thyromegaly, no masses, no bruits Heart: RRR, normal S1, S2, no murmurs Lungs: CTA bilaterally, no wheezes, rhonchi, or rales Pulses: 2+ symmetric, upper and lower extremities, normal cap refill Ext: no edema CN2-12 intact, nonfocal exam    Adult ECG Report  Indication: HTN  Rate: 73 bpm  Rhythm: normal sinus rhythm  QRS Axis: -18 degrees  PR Interval:  QRS Duration: 84ms  QTc:  Conduction Disturbances: possible septal infarct age indeterminate  Other Abnormalities: T wave inversions III, precordial leads V5, V6  Patient's cardiac risk factors are: hypertension and smoking/ tobacco exposure.  EKG comparison: none  Narrative Interpretation: abnormal EKG, T wave abnormality, possible prior septal infarct   Assessment: Encounter Diagnoses  Name Primary?  . Essential hypertension Yes  . Smoker     Plan: Reviewed prior labs from ED visit back in February.  Discussed diagnosis of hypertension, risks of uncontrolled hypertension.  Stop current  medication, change to higher dose Lisinopril HCT and add Amlodipine.   Discussed healthy diet, limited sugar and salt, c/t regular exercise.  F/u 2wk.  Will likely need to increase Amlodipine at that point.    Rheanne was seen today for elevated bp.  Diagnoses and all orders for this visit:  Essential hypertension Orders: -     EKG 12-Lead -     PR ELECTROCARDIOGRAM, COMPLETE  Smoker Orders: -     EKG 12-Lead -     PR ELECTROCARDIOGRAM, COMPLETE  Other orders -     lisinopril-hydrochlorothiazide (ZESTORETIC) 20-12.5 MG per tablet; Take 1 tablet by mouth daily. -     amLODipine (NORVASC) 5 MG tablet; Take 1 tablet (5 mg total) by mouth daily.

## 2015-03-03 ENCOUNTER — Encounter: Payer: Self-pay | Admitting: Medical

## 2015-03-03 ENCOUNTER — Ambulatory Visit (INDEPENDENT_AMBULATORY_CARE_PROVIDER_SITE_OTHER): Payer: BLUE CROSS/BLUE SHIELD | Admitting: Medical

## 2015-03-03 VITALS — BP 170/102 | HR 91 | Wt 183.0 lb

## 2015-03-03 DIAGNOSIS — I10 Essential (primary) hypertension: Secondary | ICD-10-CM | POA: Diagnosis not present

## 2015-03-03 DIAGNOSIS — R51 Headache: Secondary | ICD-10-CM

## 2015-03-03 DIAGNOSIS — J0100 Acute maxillary sinusitis, unspecified: Secondary | ICD-10-CM | POA: Diagnosis not present

## 2015-03-03 DIAGNOSIS — L309 Dermatitis, unspecified: Secondary | ICD-10-CM

## 2015-03-03 DIAGNOSIS — R519 Headache, unspecified: Secondary | ICD-10-CM

## 2015-03-03 MED ORDER — HYDROCORTISONE 2.5 % EX CREA: TOPICAL_CREAM | Freq: Two times a day (BID) | CUTANEOUS | Status: AC

## 2015-03-03 MED ORDER — AMOXICILLIN 875 MG PO TABS: 875.0000 mg | ORAL_TABLET | Freq: Two times a day (BID) | ORAL | Status: DC

## 2015-03-03 NOTE — Progress Notes (Signed)
Subjective Here for 2 wk recheck on BP.  Since last visit c/t the higher dose Lisinopril HCT, started amlodipine but didn't like the way it made her feel.  Only used it 2 days then stopped.    Still having headaches, having right maxillary sinus pressure and teeth ache on right, some head congestion, gets sinus problems regularly.    Having some eczema flare.  Wants cream for this.  No other aggravating or relieving factors. No other complaint.  Past Medical History  Diagnosis Date  . Hypertension   . Bronchitis    ROS as in subjective   Objective: BP 170/102 mmHg  Pulse 91  Wt 183 lb (83.008 kg)  SpO2 99%  Gen: wd, wn, nad HENT  - tender right maxillary sinus, nares with erythema, mild turbinated edema, TMs pearly Neck: supple, non tender, no lymphadenopathy, no mass Lungs clear Heart:RRR, normal S1, S2, no murmurs Neuro: CN2-12 intact, nonfocal   Assessment: Encounter Diagnoses  Name Primary?  . Essential hypertension Yes  . Headache disorder   . Dermatitis   . Acute maxillary sinusitis, recurrence not specified     Plan: HTN - encouraged her to give amlodipine one more try.   C/t Lisinopril HCT, restart Amlodipine 1/2 tablet daily x 1 wk, then 1 tablet po daily.   Call if not tolerating medication without 2 wk.  Headaches - likely multifactorial.   Avoid possible triggers as discussed  dermatitis - use triamcinolone cream prn for flares, c/t daily moisturizing lotion  Sinusitis - begin amoxicillin   Diane Hunter was seen today for follow-up.  Diagnoses and all orders for this visit:  Essential hypertension -     Comprehensive metabolic panel -     TSH -     CBC  Headache disorder  Dermatitis  Acute maxillary sinusitis, recurrence not specified  Other orders -     amoxicillin (AMOXIL) 875 MG tablet; Take 1 tablet (875 mg total) by mouth 2 (two) times daily. -     hydrocortisone 2.5 % cream; Apply topically 2 (two) times daily.

## 2015-03-04 LAB — TSH: TSH: 0.373 u[IU]/mL (ref 0.350–4.500)

## 2015-03-04 LAB — COMPREHENSIVE METABOLIC PANEL
ALT: 7 U/L (ref 6–29)
AST: 10 U/L (ref 10–30)
Albumin: 4 g/dL (ref 3.6–5.1)
Alkaline Phosphatase: 63 U/L (ref 33–115)
BUN: 12 mg/dL (ref 7–25)
CHLORIDE: 105 mmol/L (ref 98–110)
CO2: 24 mmol/L (ref 20–31)
CREATININE: 1.07 mg/dL (ref 0.50–1.10)
Calcium: 9.2 mg/dL (ref 8.6–10.2)
GLUCOSE: 96 mg/dL (ref 65–99)
POTASSIUM: 4 mmol/L (ref 3.5–5.3)
SODIUM: 135 mmol/L (ref 135–146)
TOTAL PROTEIN: 6.8 g/dL (ref 6.1–8.1)
Total Bilirubin: 0.4 mg/dL (ref 0.2–1.2)

## 2015-03-04 LAB — CBC
HEMATOCRIT: 42.6 % (ref 36.0–46.0)
Hemoglobin: 14 g/dL (ref 12.0–15.0)
MCH: 31.8 pg (ref 26.0–34.0)
MCHC: 32.9 g/dL (ref 30.0–36.0)
MCV: 96.8 fL (ref 78.0–100.0)
MPV: 10.2 fL (ref 8.6–12.4)
Platelets: 258 10*3/uL (ref 150–400)
RBC: 4.4 MIL/uL (ref 3.87–5.11)
RDW: 12.8 % (ref 11.5–15.5)
WBC: 6.8 10*3/uL (ref 4.0–10.5)

## 2015-05-25 ENCOUNTER — Telehealth: Payer: Self-pay

## 2015-05-25 ENCOUNTER — Other Ambulatory Visit: Payer: Self-pay | Admitting: Medical

## 2015-05-25 MED ORDER — LISINOPRIL-HYDROCHLOROTHIAZIDE 20-12.5 MG PO TABS: 1.0000 | ORAL_TABLET | Freq: Every day | ORAL | Status: DC

## 2015-05-25 NOTE — Telephone Encounter (Signed)
Pt called requesting a refill on her lisinopril pt uses [facility] on [address] pt can be reached at [phone]

## 2015-09-11 ENCOUNTER — Other Ambulatory Visit: Payer: Self-pay | Admitting: Medical

## 2016-04-10 ENCOUNTER — Other Ambulatory Visit: Payer: Self-pay | Admitting: Medical

## 2016-08-04 ENCOUNTER — Emergency Department (HOSPITAL_COMMUNITY)
Admission: EM | Admit: 2016-08-04 | Discharge: 2016-08-04 | Disposition: A | Discharge status: Home / Self Care | Payer: BLUE CROSS/BLUE SHIELD | Attending: Emergency Medicine | Admitting: Emergency Medicine

## 2016-08-04 ENCOUNTER — Encounter (HOSPITAL_COMMUNITY): Payer: Self-pay | Admitting: Emergency Medicine

## 2016-08-04 DIAGNOSIS — Z79899 Other long term (current) drug therapy: Secondary | ICD-10-CM | POA: Insufficient documentation

## 2016-08-04 DIAGNOSIS — I10 Essential (primary) hypertension: Secondary | ICD-10-CM | POA: Insufficient documentation

## 2016-08-04 DIAGNOSIS — F17200 Nicotine dependence, unspecified, uncomplicated: Secondary | ICD-10-CM | POA: Insufficient documentation

## 2016-08-04 DIAGNOSIS — K047 Periapical abscess without sinus: Secondary | ICD-10-CM

## 2016-08-04 DIAGNOSIS — Z7982 Long term (current) use of aspirin: Secondary | ICD-10-CM | POA: Insufficient documentation

## 2016-08-04 MED ORDER — LISINOPRIL-HYDROCHLOROTHIAZIDE 20-12.5 MG PO TABS: 1.0000 | ORAL_TABLET | Freq: Every day | ORAL | 0 refills | Status: DC

## 2016-08-04 MED ORDER — PENICILLIN V POTASSIUM 500 MG PO TABS: 500.0000 mg | ORAL_TABLET | Freq: Three times a day (TID) | ORAL | 0 refills | Status: DC

## 2016-08-04 MED ORDER — IBUPROFEN 800 MG PO TABS: 800.0000 mg | ORAL_TABLET | Freq: Three times a day (TID) | ORAL | 0 refills | Status: AC

## 2016-08-04 NOTE — ED Triage Notes (Signed)
Pt sts right sided dental pain with swelling x 2 days 

## 2016-08-04 NOTE — ED Notes (Signed)
Is requesting rx for her BP meds.

## 2016-08-04 NOTE — ED Provider Notes (Signed)
[facility]-EMERGENCY DEPT Provider Note   CSN: [id number] Arrival date & time: 08/04/16  1127     History   Chief Complaint Chief Complaint  Patient presents with  . Dental Pain    HPI Diane Hunter is a 46 y.o. female.  HPI   46 year old female presenting with complaints of dental pain. Patient states one of her tooth broke off several months ago but it did not cause any problem until yesterday. Since yesterday she has had sharp throbbing pain to the affected tooth, worsening with chewing and now she noticed facial swelling. She denies any specific treatment tried. She denies any associated fever, ear pain, trouble swallowing, neck pain, pain with eye movement. She does not have a dentist. She is a smoker. Patient also has history of hypertension but has ran out of her blood pressure medication for the past month. She does not have a primary care provider.  Past Medical History:  Diagnosis Date  . Bronchitis   . Hypertension     There are no active problems to display for this patient.   History reviewed. No pertinent surgical history.  OB History    No data available       Home Medications    Prior to Admission medications   Medication Sig Start Date End Date Taking? Authorizing Provider  amoxicillin (AMOXIL) 875 MG tablet Take 1 tablet (875 mg total) by mouth 2 (two) times daily. 03/03/15   Kermit Balo Tysinger, PA-C  aspirin-acetaminophen-caffeine (EXCEDRIN MIGRAINE) [phone] MG per tablet Take 2 tablets by mouth every 6 (six) hours as needed. migraine    Historical Provider, MD  hydrocortisone 2.5 % cream Apply topically 2 (two) times daily. 03/03/15   Kermit Balo Tysinger, PA-C  lisinopril-hydrochlorothiazide (PRINZIDE,ZESTORETIC) 20-12.5 MG tablet TAKE ONE TABLET BY MOUTH ONCE DAILY 09/11/15   Jac Canavan, PA-C    Family History Family History  Problem Relation Age of Onset  . Lupus Mother   . Rheum arthritis Mother   . Cancer Other   . Diabetes Other   .  Hypertension Other     Social History Social History  Substance Use Topics  . Smoking status: Current Every Day Smoker    Packs/day: 0.25  . Smokeless tobacco: Never Used  . Alcohol use No     Allergies   Pork-derived products   Review of Systems Review of Systems  Constitutional: Negative for fever.  HENT: Positive for dental problem and facial swelling.   Respiratory: Negative for shortness of breath.   Cardiovascular: Negative for chest pain.  All other systems reviewed and are negative.    Physical Exam Updated Vital Signs BP (!) 187/120 (BP Location: Right Arm)   Pulse 120   Temp 99.3 F (37.4 C) (Oral)   Resp 19   Ht 5\' 3"  (1.6 m)   Wt 79.4 kg   SpO2 100%   BMI 31.00 kg/m   Physical Exam  Constitutional: She appears well-developed and well-nourished. No distress.  HENT:  Head: Atraumatic.  Right Ear: External ear normal.  Left Ear: External ear normal.  Mouth/Throat: Oropharynx is clear and moist.  Mouth: Widespread dental decay with significant decay to tooth #6, tenderness to palpation. Adjacent facial swelling noted but no obvious abscess amenable for drainage. No trismus.  Eyes: Conjunctivae are normal.  Neck: Neck supple.  Lymphadenopathy:    She has no cervical adenopathy.  Neurological: She is alert.  Skin: No rash noted.  Psychiatric: She has a normal mood and affect.  Nursing note and vitals reviewed.    ED Treatments / Results  Labs (all labs ordered are listed, but only abnormal results are displayed) Labs Reviewed - No data to display  EKG  EKG Interpretation None       Radiology No results found.  Procedures Procedures (including critical care time)  Medications Ordered in ED Medications - No data to display   Initial Impression / Assessment and Plan / ED Course  I have reviewed the triage vital signs and the nursing notes.  Pertinent labs & imaging results that were available during my care of the patient were  reviewed by me and considered in my medical decision making (see chart for details).     BP (!) 180/113 (BP Location: Right Arm)   Pulse 89   Temp 99.3 F (37.4 C) (Oral)   Resp 16   Ht 5\' 3"  (1.6 m)   Wt 79.4 kg   SpO2 98%   BMI 31.00 kg/m  The patient was noted to be hypertensive today in the emergency department. I have spoken with the patient regarding hypertension and the need for improved management. I instructed the patient to followup with the Primary care doctor within 4 days to improve the management of the patient's hypertension. Resources provided and medication refill.I also counseled the patient regarding the signs and symptoms which would require an emergent visit to an emergency department for hypertensive urgency and/or hypertensive emergency. The patient understood the need for improved hypertensive management.   Final Clinical Impressions(s) / ED Diagnoses   Final diagnoses:  Periapical abscess with facial involvement  Essential hypertension    New Prescriptions Discharge Medication List as of 08/04/2016  1:18 PM    START taking these medications   Details  ibuprofen (ADVIL,MOTRIN) 800 MG tablet Take 1 tablet (800 mg total) by mouth 3 (three) times daily., Starting Thu 08/04/2016, Print    penicillin v potassium (VEETID) 500 MG tablet Take 1 tablet (500 mg total) by mouth 3 (three) times daily., Starting Thu 08/04/2016, Print       12:21 PM Patient here with dental pain and facial swelling suggestive of a periapical abscess. Doubt orbital cellulitis or preseptal cellulitis. She also is hypertensive, this is likely secondary to pain and not having her blood pressure medication for the past month. Patient will be discharged with antibiotic, antiinflammatory medication, as well as a refill of her lisinopril/hydrochlorothiazide. Dental referral given. Return precaution discussed.   Fayrene HelperBowie Kedarius Aloisi, PA-C 08/04/16 1614    Jerelyn ScottMartha Linker, MD 08/06/16 [phone]

## 2016-09-02 ENCOUNTER — Encounter (HOSPITAL_COMMUNITY): Payer: Self-pay

## 2016-09-02 ENCOUNTER — Emergency Department (HOSPITAL_COMMUNITY)
Admission: EM | Admit: 2016-09-02 | Discharge: 2016-09-02 | Disposition: A | Discharge status: Home / Self Care | Payer: BLUE CROSS/BLUE SHIELD | Attending: Dermatology | Admitting: Dermatology

## 2016-09-02 DIAGNOSIS — Z5321 Procedure and treatment not carried out due to patient leaving prior to being seen by health care provider: Secondary | ICD-10-CM | POA: Insufficient documentation

## 2016-09-02 DIAGNOSIS — F17200 Nicotine dependence, unspecified, uncomplicated: Secondary | ICD-10-CM | POA: Insufficient documentation

## 2016-09-02 DIAGNOSIS — R05 Cough: Secondary | ICD-10-CM | POA: Insufficient documentation

## 2016-09-02 DIAGNOSIS — I10 Essential (primary) hypertension: Secondary | ICD-10-CM | POA: Insufficient documentation

## 2016-09-02 DIAGNOSIS — R509 Fever, unspecified: Secondary | ICD-10-CM | POA: Insufficient documentation

## 2016-09-02 NOTE — ED Triage Notes (Signed)
Pt complianing of fever/chills. Pt also complaining of non productive cough x 3 days. Pt states symptoms mostly resolved at this time.

## 2016-09-02 NOTE — ED Notes (Signed)
PT up to nurse first desk stating needs to leave to attend to an emergency. Explained process and apologized for wait, pt insists needs to leave.

## 2017-08-26 ENCOUNTER — Emergency Department (HOSPITAL_COMMUNITY)
Admission: EM | Admit: 2017-08-26 | Discharge: 2017-08-26 | Disposition: A | Discharge status: Home / Self Care | Payer: BLUE CROSS/BLUE SHIELD | Attending: Emergency Medicine | Admitting: Emergency Medicine

## 2017-08-26 ENCOUNTER — Encounter (HOSPITAL_COMMUNITY): Payer: Self-pay

## 2017-08-26 DIAGNOSIS — F17210 Nicotine dependence, cigarettes, uncomplicated: Secondary | ICD-10-CM | POA: Diagnosis not present

## 2017-08-26 DIAGNOSIS — I10 Essential (primary) hypertension: Secondary | ICD-10-CM | POA: Diagnosis not present

## 2017-08-26 DIAGNOSIS — K047 Periapical abscess without sinus: Secondary | ICD-10-CM

## 2017-08-26 DIAGNOSIS — K0889 Other specified disorders of teeth and supporting structures: Secondary | ICD-10-CM | POA: Diagnosis not present

## 2017-08-26 DIAGNOSIS — R600 Localized edema: Secondary | ICD-10-CM | POA: Diagnosis present

## 2017-08-26 DIAGNOSIS — Z79899 Other long term (current) drug therapy: Secondary | ICD-10-CM | POA: Diagnosis not present

## 2017-08-26 MED ORDER — LISINOPRIL-HYDROCHLOROTHIAZIDE 20-12.5 MG PO TABS: 1.0000 | ORAL_TABLET | Freq: Every day | ORAL | 1 refills | Status: AC

## 2017-08-26 MED ORDER — TRAMADOL HCL 50 MG PO TABS: 50.0000 mg | ORAL_TABLET | Freq: Four times a day (QID) | ORAL | 0 refills | Status: AC | PRN

## 2017-08-26 MED ORDER — PENICILLIN V POTASSIUM 500 MG PO TABS: 500.0000 mg | ORAL_TABLET | Freq: Four times a day (QID) | ORAL | 0 refills | Status: AC

## 2017-08-26 NOTE — ED Provider Notes (Signed)
[facility]-EMERGENCY DEPT Provider Note   CSN: [id number] Arrival date & time: 08/26/17  0935     History   Chief Complaint Chief Complaint  Patient presents with  . Facial Swelling  . Dental Pain    HPI Diane Hunter is a 47 y.o. female.  Patient complains of swelling to her left cheek with pain in her upper teeth.  Also patient has a history of hypertension and has not taken any medicine for quite some time   The history is provided by the patient.  Dental Pain   This is a new problem. The current episode started 2 days ago. The problem occurs constantly. The problem has not changed since onset.The pain is at a severity of 6/10. The pain is moderate. She has tried acetaminophen for the symptoms. The treatment provided moderate relief.    Past Medical History:  Diagnosis Date  . Bronchitis   . Hypertension     There are no active problems to display for this patient.   History reviewed. No pertinent surgical history.  OB History    No data available       Home Medications    Prior to Admission medications   Medication Sig Start Date End Date Taking? Authorizing Provider  amoxicillin (AMOXIL) 875 MG tablet Take 1 tablet (875 mg total) by mouth 2 (two) times daily. 03/03/15   Tysinger, Kermit Balo, PA-C  aspirin-acetaminophen-caffeine (EXCEDRIN MIGRAINE) [phone] MG per tablet Take 2 tablets by mouth every 6 (six) hours as needed. migraine    [provider]  hydrocortisone 2.5 % cream Apply topically 2 (two) times daily. 03/03/15   Tysinger, Kermit Balo, PA-C  ibuprofen (ADVIL,MOTRIN) 800 MG tablet Take 1 tablet (800 mg total) by mouth 3 (three) times daily. 08/04/16   Fayrene Helper, PA-C  lisinopril-hydrochlorothiazide (ZESTORETIC) 20-12.5 MG tablet Take 1 tablet by mouth daily. 08/26/17   Bethann Berkshire, MD  penicillin v potassium (VEETID) 500 MG tablet Take 1 tablet (500 mg total) by mouth 4 (four) times daily for 7 days. 08/26/17 09/02/17  Bethann Berkshire, MD  traMADol (ULTRAM) 50 MG tablet Take 1 tablet (50 mg total) by mouth every 6 (six) hours as needed. 08/26/17   Bethann Berkshire, MD    Family History Family History  Problem Relation Age of Onset  . Lupus Mother   . Rheum arthritis Mother   . Cancer Other   . Diabetes Other   . Hypertension Other     Social History Social History   Tobacco Use  . Smoking status: Current Every Day Smoker    Packs/day: 0.25  . Smokeless tobacco: Never Used  Substance Use Topics  . Alcohol use: No  . Drug use: No     Allergies   Pork-derived products   Review of Systems Review of Systems  Constitutional: Negative for appetite change and fatigue.  HENT: Negative for congestion, ear discharge and sinus pressure.        Facial swelling and dental pain  Eyes: Negative for discharge.  Respiratory: Negative for cough.   Cardiovascular: Negative for chest pain.  Gastrointestinal: Negative for abdominal pain and diarrhea.  Genitourinary: Negative for frequency and hematuria.  Musculoskeletal: Negative for back pain.  Skin: Negative for rash.  Neurological: Negative for seizures and headaches.  Psychiatric/Behavioral: Negative for hallucinations.     Physical Exam Updated Vital Signs BP (!) 184/101 (BP Location: Left Arm)   Pulse 76   Temp 98.5 F (36.9 C) (Oral)  Resp 16   Ht 5\' 3"  (1.6 m)   Wt 81.6 kg (180 lb)   LMP 08/20/2017   SpO2 100%   BMI 31.89 kg/m   Physical Exam  Constitutional: She is oriented to person, place, and time. She appears well-developed.  HENT:  Head: Normocephalic.  Patient has tenderness swelling to left cheek.  She also has poor dentition with tenderness to her left upper molars  Eyes: Conjunctivae and EOM are normal. No scleral icterus.  Neck: Neck supple. No thyromegaly present.  Cardiovascular: Normal rate and regular rhythm. Exam reveals no gallop and no friction rub.  No murmur heard. Pulmonary/Chest: No stridor. She has no wheezes. She  has no rales. She exhibits no tenderness.  Abdominal: She exhibits no distension. There is no tenderness. There is no rebound.  Musculoskeletal: Normal range of motion. She exhibits no edema.  Lymphadenopathy:    She has no cervical adenopathy.  Neurological: She is oriented to person, place, and time. She exhibits normal muscle tone. Coordination normal.  Skin: No rash noted. No erythema.  Psychiatric: She has a normal mood and affect. Her behavior is normal.     ED Treatments / Results  Labs (all labs ordered are listed, but only abnormal results are displayed) Labs Reviewed - No data to display  EKG  EKG Interpretation None       Radiology No results found.  Procedures Procedures (including critical care time)  Medications Ordered in ED Medications - No data to display   Initial Impression / Assessment and Plan / ED Course  I have reviewed the triage vital signs and the nursing notes.  Pertinent labs & imaging results that were available during my care of the patient were reviewed by me and considered in my medical decision making (see chart for details).     Patient has an abscessed tooth.  We will place her on penicillin and Ultram.  She also has uncontrolled high blood pressure.  I will place her back on the medicine she was on before and she can follow-up with a PCP  Final Clinical Impressions(s) / ED Diagnoses   Final diagnoses:  Dental abscess  Essential hypertension    ED Discharge Orders        Ordered    penicillin v potassium (VEETID) 500 MG tablet  4 times daily     08/26/17 1028    traMADol (ULTRAM) 50 MG tablet  Every 6 hours PRN     08/26/17 1028    lisinopril-hydrochlorothiazide (ZESTORETIC) 20-12.5 MG tablet  Daily     08/26/17 1028       Bethann BerkshireZammit, Khilee Hendricksen, MD 08/26/17 1032

## 2017-08-26 NOTE — Discharge Instructions (Signed)
Follow-up with a dentist in the next week.  Follow-up with a family doctor for your blood pressure.  We have referred you to Dr. Lucky CowboyKnox at the dentist.  When you make the appointment with Dr. Lucky CowboyKnox you can tell them you referred for the emergency department

## 2017-08-26 NOTE — ED Triage Notes (Signed)
Pt with left face swelling and painful since last night.  Unsure if into teeth.  Radiates to ear.  No fever.

## 2017-10-18 ENCOUNTER — Other Ambulatory Visit: Payer: Self-pay | Admitting: Obstetrics and Gynecology

## 2017-10-18 DIAGNOSIS — R928 Other abnormal and inconclusive findings on diagnostic imaging of breast: Secondary | ICD-10-CM

## 2017-10-20 ENCOUNTER — Ambulatory Visit
Admission: RE | Admit: 2017-10-20 | Discharge: 2017-10-20 | Disposition: A | Discharge status: Home / Self Care | Payer: BLUE CROSS/BLUE SHIELD | Source: Ambulatory Visit | Attending: Obstetrics and Gynecology | Admitting: Obstetrics and Gynecology

## 2017-10-20 DIAGNOSIS — R928 Other abnormal and inconclusive findings on diagnostic imaging of breast: Secondary | ICD-10-CM

## 2019-04-21 ENCOUNTER — Emergency Department (HOSPITAL_COMMUNITY): Payer: BC Managed Care – PPO

## 2019-04-21 ENCOUNTER — Other Ambulatory Visit: Payer: Self-pay

## 2019-04-21 ENCOUNTER — Encounter (HOSPITAL_COMMUNITY): Payer: Self-pay

## 2019-04-21 ENCOUNTER — Emergency Department (HOSPITAL_COMMUNITY)
Admission: EM | Admit: 2019-04-21 | Discharge: 2019-04-21 | Disposition: A | Discharge status: Home / Self Care | Payer: BC Managed Care – PPO | Attending: Emergency Medicine | Admitting: Emergency Medicine

## 2019-04-21 DIAGNOSIS — Z79899 Other long term (current) drug therapy: Secondary | ICD-10-CM | POA: Insufficient documentation

## 2019-04-21 DIAGNOSIS — Z23 Encounter for immunization: Secondary | ICD-10-CM | POA: Insufficient documentation

## 2019-04-21 DIAGNOSIS — Y9389 Activity, other specified: Secondary | ICD-10-CM | POA: Insufficient documentation

## 2019-04-21 DIAGNOSIS — F17210 Nicotine dependence, cigarettes, uncomplicated: Secondary | ICD-10-CM | POA: Diagnosis not present

## 2019-04-21 DIAGNOSIS — I10 Essential (primary) hypertension: Secondary | ICD-10-CM | POA: Diagnosis not present

## 2019-04-21 DIAGNOSIS — Y92099 Unspecified place in other non-institutional residence as the place of occurrence of the external cause: Secondary | ICD-10-CM | POA: Insufficient documentation

## 2019-04-21 DIAGNOSIS — W540XXA Bitten by dog, initial encounter: Secondary | ICD-10-CM | POA: Diagnosis not present

## 2019-04-21 DIAGNOSIS — Y999 Unspecified external cause status: Secondary | ICD-10-CM | POA: Diagnosis not present

## 2019-04-21 DIAGNOSIS — Z2914 Encounter for prophylactic rabies immune globin: Secondary | ICD-10-CM | POA: Insufficient documentation

## 2019-04-21 DIAGNOSIS — S61451A Open bite of right hand, initial encounter: Secondary | ICD-10-CM | POA: Insufficient documentation

## 2019-04-21 LAB — POC URINE PREG, ED: Preg Test, Ur: NEGATIVE

## 2019-04-21 MED ORDER — AMOXICILLIN-POT CLAVULANATE 875-125 MG PO TABS
1.0000 | ORAL_TABLET | Freq: Once | ORAL | Status: AC
  Administered 2019-04-21: 12:00:00 1 via ORAL
  Filled 2019-04-21: qty 1

## 2019-04-21 MED ORDER — RABIES IMMUNE GLOBULIN 150 UNIT/ML IM INJ
20.0000 [IU]/kg | INJECTION | Freq: Once | INTRAMUSCULAR | Status: AC
  Administered 2019-04-21: 13:00:00 1500 [IU] via INTRAMUSCULAR
  Filled 2019-04-21: qty 10

## 2019-04-21 MED ORDER — RABIES VACCINE, PCEC IM SUSR
1.0000 mL | Freq: Once | INTRAMUSCULAR | Status: AC
  Administered 2019-04-21: 1 mL via INTRAMUSCULAR
  Filled 2019-04-21: qty 1

## 2019-04-21 MED ORDER — LISINOPRIL 20 MG PO TABS
20.0000 mg | ORAL_TABLET | Freq: Once | ORAL | Status: AC
  Administered 2019-04-21: 20 mg via ORAL
  Filled 2019-04-21: qty 1

## 2019-04-21 MED ORDER — LISINOPRIL-HYDROCHLOROTHIAZIDE 20-12.5 MG PO TABS: 1.0000 | ORAL_TABLET | Freq: Every day | ORAL | 1 refills | Status: AC

## 2019-04-21 MED ORDER — HYDROCHLOROTHIAZIDE 12.5 MG PO CAPS
12.5000 mg | ORAL_CAPSULE | Freq: Once | ORAL | Status: AC
  Administered 2019-04-21: 12.5 mg via ORAL
  Filled 2019-04-21: qty 1

## 2019-04-21 MED ORDER — BUPIVACAINE HCL (PF) 0.5 % IJ SOLN
10.0000 mL | Freq: Once | INTRAMUSCULAR | Status: AC
  Administered 2019-04-21: 13:00:00 10 mL
  Filled 2019-04-21: qty 30

## 2019-04-21 MED ORDER — LIDOCAINE-EPINEPHRINE (PF) 2 %-1:200000 IJ SOLN
10.0000 mL | Freq: Once | INTRAMUSCULAR | Status: AC
  Administered 2019-04-21: 10 mL
  Filled 2019-04-21: qty 10

## 2019-04-21 MED ORDER — AMOXICILLIN-POT CLAVULANATE 875-125 MG PO TABS: 1.0000 | ORAL_TABLET | Freq: Two times a day (BID) | ORAL | 0 refills | Status: DC

## 2019-04-21 NOTE — ED Notes (Signed)
Ortho Tech notified of order for a thumb spica.

## 2019-04-21 NOTE — ED Notes (Signed)
Patient transported to X-ray 

## 2019-04-21 NOTE — ED Notes (Signed)
Fax sent to [facility]

## 2019-04-21 NOTE — ED Provider Notes (Signed)
[facility] DEPT Provider Note   CSN: [id number] Arrival date & time: 04/21/19  1031     History   Chief Complaint Chief Complaint  Patient presents with  . Animal Bite    HPI Diane Hunter is a 48 y.o. female.     HPI    Diane Hunter is a 49 y.o. female, with a history of HTN, presenting to the ED with dog bite to the right hand that occurred shortly prior to arrival.   Patient states the neighbors dog appeared to be under fed, she tried to feed it, and the dog bit her on the hand. She has a laceration and puncture wound to the palmar right hand at the base of the thumb.  Her pain is currently minimal.  Tetanus up-to-date. She does not know the rabies vaccination status of the dog.  After making a phone call, the answer she received was, "maybe 5 years ago." Denies numbness, weakness, other injuries. LMP July 2020, but she states she thinks she may be going through menopause.  In regards to her hypertension, patient states she has been off of her lisinopril-HCTZ combination for the past several months.  She denies headache, dizziness, chest pain, shortness of breath, vision changes, or related complaints.    Past Medical History:  Diagnosis Date  . Bronchitis   . Hypertension     There are no active problems to display for this patient.   History reviewed. No pertinent surgical history.   OB History   No obstetric history on file.      Home Medications    Prior to Admission medications   Medication Sig Start Date End Date Taking? Authorizing Provider  amoxicillin-clavulanate (AUGMENTIN) 875-125 MG tablet Take 1 tablet by mouth every 12 (twelve) hours. 04/21/19   Joy, Shawn C, PA-C  aspirin-acetaminophen-caffeine (EXCEDRIN MIGRAINE) [id number] MG per tablet Take 2 tablets by mouth every 6 (six) hours as needed. migraine    [provider]  hydrocortisone 2.5 % cream Apply topically 2 (two) times daily. 03/03/15   Tysinger,  Camelia Eng, PA-C  ibuprofen (ADVIL,MOTRIN) 800 MG tablet Take 1 tablet (800 mg total) by mouth 3 (three) times daily. 08/04/16   Domenic Moras, PA-C  lisinopril-hydrochlorothiazide (ZESTORETIC) 20-12.5 MG tablet Take 1 tablet by mouth daily. 08/26/17   Milton Ferguson, MD  lisinopril-hydrochlorothiazide (ZESTORETIC) 20-12.5 MG tablet Take 1 tablet by mouth daily. 04/21/19 06/20/19  Joy, Shawn C, PA-C  traMADol (ULTRAM) 50 MG tablet Take 1 tablet (50 mg total) by mouth every 6 (six) hours as needed. 08/26/17   Milton Ferguson, MD    Family History Family History  Problem Relation Age of Onset  . Lupus Mother   . Rheum arthritis Mother   . Cancer Other   . Diabetes Other   . Hypertension Other     Social History Social History   Tobacco Use  . Smoking status: Current Every Day Smoker    Packs/day: 0.25  . Smokeless tobacco: Never Used  Substance Use Topics  . Alcohol use: No  . Drug use: No     Allergies   Pork-derived products   Review of Systems Review of Systems  Constitutional: Negative for diaphoresis.  Respiratory: Negative for shortness of breath.   Cardiovascular: Negative for chest pain and leg swelling.  Musculoskeletal: Negative for joint swelling.  Skin: Positive for wound.  Neurological: Negative for dizziness, syncope, weakness, light-headedness, numbness and headaches.     Physical Exam Updated Vital Signs BP Marland Kitchen)  206/121   Pulse 82   Temp 98.4 F (36.9 C) (Oral)   Resp 16   Ht  (1.6 m)   Wt 74.8 kg   SpO2 97%   BMI 29.23 kg/m   Physical Exam Vitals signs and nursing note reviewed.  Constitutional:      General: She is not in acute distress.    Appearance: She is well-developed. She is not diaphoretic.  HENT:     Head: Normocephalic and atraumatic.  Eyes:     Conjunctiva/sclera: Conjunctivae normal.  Neck:     Musculoskeletal: Neck supple.  Cardiovascular:     Rate and Rhythm: Normal rate and regular rhythm.     Pulses:          Radial pulses  are 2+ on the right side and 2+ on the left side.  Pulmonary:     Effort: Pulmonary effort is normal.  Musculoskeletal:     Comments: Approximately 2.5 cm laceration to the palmar right hand at the base of the thumb. Full range of motion in the thumb without noted difficulty. Full range of motion in the rest of the hand and wrist without pain, tenderness, swelling, deformity, or color change.  Skin:    General: Skin is warm and dry.     Capillary Refill: Capillary refill takes less than 2 seconds.     Coloration: Skin is not pale.  Neurological:     Mental Status: She is alert.     Comments: Sensation grossly intact to light touch in the extremities.  Grip strengths equal bilaterally.  Strength 5/5 in all extremities. No gait disturbance. Coordination intact. Cranial nerves III-XII grossly intact. No facial droop.   Sensation grossly intact to light touch through each of the nerve distributions of the bilateral upper extremities. Abduction and adduction of the fingers intact against resistance. Grip strength equal bilaterally. Supination and pronation intact against resistance. Strength 5/5 through the cardinal directions of the bilateral wrists. Strength 5/5 with flexion and extension of the bilateral elbows. Patient can touch the thumb to each one of the fingertips without difficulty.  Patient can hold the "OK" sign against resistance.  Psychiatric:        Behavior: Behavior normal.                  ED Treatments / Results  Labs (all labs ordered are listed, but only abnormal results are displayed) Labs Reviewed  POC URINE PREG, ED    EKG None  Radiology Dg Hand Complete Right  Result Date: 04/21/2019 CLINICAL DATA:  Dog bite to base of thumb EXAM: RIGHT HAND - COMPLETE 3+ VIEW COMPARISON:  None. FINDINGS: No fracture or dislocation of the right hand. Joint spaces are well preserved. There is soft tissue laceration near the thumb metacarpophalangeal joint. No  radiopaque foreign body noted. IMPRESSION: No fracture or dislocation of the right hand. There is soft tissue laceration near the thumb metacarpophalangeal joint. No radiopaque foreign body noted. Electronically Signed   By: Lauralyn Primes M.D.   On: 04/21/2019 12:03    Procedures .Marland KitchenLaceration Repair  Date/Time: 04/21/2019 12:30 PM Performed by: Anselm Pancoast, PA-C Authorized by: Anselm Pancoast, PA-C   Consent:    Consent obtained:  Verbal   Consent given by:  Patient   Risks discussed:  Infection, pain, retained foreign body, tendon damage, vascular damage, nerve damage, need for additional repair, poor cosmetic result and poor wound healing Anesthesia (see MAR for exact dosages):  Anesthesia method:  Local infiltration   Local anesthetic:  Bupivacaine 0.5% w/o epi and lidocaine 2% WITH epi Laceration details:    Location:  Hand   Hand location:  R palm   Length (cm):  2.5 Repair type:    Repair type:  Intermediate Pre-procedure details:    Preparation:  Patient was prepped and draped in usual sterile fashion and imaging obtained to evaluate for foreign bodies Exploration:    Hemostasis achieved with:  Epinephrine   Wound exploration: wound explored through full range of motion   Treatment:    Area cleansed with:  Betadine and saline   Amount of cleaning:  Extensive   Irrigation solution:  Tap water   Irrigation method:  Tap Skin repair:    Repair method:  Sutures   Suture size:  4-0   Wound skin closure material used: Vicryl.   Suture technique:  Simple interrupted   Number of sutures:  6 Approximation:    Approximation:  Loose Post-procedure details:    Dressing:  Non-adherent dressing, sterile dressing and splint for protection   Patient tolerance of procedure:  Tolerated well, no immediate complications   (including critical care time)  Medications Ordered in ED Medications  rabies immune globulin (HYPERAB/KEDRAB) injection 1,500 Units (1,500 Units Intramuscular  Given 04/21/19 1252)  rabies vaccine (RABAVERT) injection 1 mL (1 mL Intramuscular Given 04/21/19 1303)  amoxicillin-clavulanate (AUGMENTIN) 875-125 MG per tablet 1 tablet (1 tablet Oral Given 04/21/19 1137)  bupivacaine (MARCAINE) 0.5 % injection 10 mL (10 mLs Infiltration Given 04/21/19 1310)  lidocaine-EPINEPHrine (XYLOCAINE W/EPI) 2 %-1:200000 (PF) injection 10 mL (10 mLs Infiltration Given 04/21/19 1310)  lisinopril (ZESTRIL) tablet 20 mg (20 mg Oral Given 04/21/19 1139)  hydrochlorothiazide (MICROZIDE) capsule 12.5 mg (12.5 mg Oral Given 04/21/19 1137)     Initial Impression / Assessment and Plan / ED Course  I have reviewed the triage vital signs and the nursing notes.  Pertinent labs & imaging results that were available during my care of the patient were reviewed by me and considered in my medical decision making (see chart for details).  Clinical Course as of Apr 20 1421  Wynelle LinkSun Apr 21, 2019  1220 Dr. Eulah PontMurphy, on call for hand surgery, currently in OR. Spoke with nurse who relayed the information to Dr. Eulah PontMurphy.  Dr. Eulah PontMurphy agrees with the plan for loose suture, antibiotics, and office follow-up.  States she may come to the office to be seen Wednesday, October 7 at 8:30 AM.   [SJ]    Clinical Course User Index [SJ] Joy, Shawn C, PA-C       Patient presents with laceration to the right hand due to a dog bite.  No evidence on exam of neurovascular compromise.  No acute osseous abnormality or sign of foreign body on x-ray.  Wound loosely sutured and patient started on antibiotics.  She was given the recommended rabies vaccine schedule. She will follow-up with hand surgery in the office. The patient was given instructions for home care as well as return precautions. Patient voices understanding of these instructions, accepts the plan, and is comfortable with discharge.    Final Clinical Impressions(s) / ED Diagnoses   Final diagnoses:  Dog bite of right hand, initial encounter    ED  Discharge Orders         Ordered    amoxicillin-clavulanate (AUGMENTIN) 875-125 MG tablet  Every 12 hours     04/21/19 1328    lisinopril-hydrochlorothiazide (ZESTORETIC) 20-12.5 MG tablet  Daily  04/21/19 1328           Anselm Pancoast, PA-C 04/21/19 1425    Cathren Laine, MD 04/22/19 [phone]

## 2019-04-21 NOTE — Discharge Instructions (Addendum)
°  Wound Care - Laceration  After you have been seen by the hand specialist, clean the wound daily to prevent infection. Do not use cleaners such as hydrogen peroxide or alcohol.  Clean the wound and surrounding area gently with tap water and mild soap. Rinse well and blot dry. Do not scrub the wound, as this may cause the wound edges to come apart. You may shower, but avoid submerging the wound, such as with a bath or swimming.  Scar reduction: Application of a topical antibiotic ointment, such as Neosporin, after the wound has begun to close and heal well can decrease scab formation and reduce scarring. After the wound has healed and wound closures have been removed, application of ointments such as Aquaphor can also reduce scar formation.  The key to scar reduction is keeping the skin well hydrated and supple. Drinking plenty of water throughout the day (At least eight 8oz glasses of water a day) is essential to staying well hydrated.  Sun exposure: Keep the wound out of the sun. After the wound has healed, continue to protect it from the sun by wearing protective clothing or applying sunscreen.  Pain: You may use Tylenol, naproxen, or ibuprofen for pain. Antiinflammatory medications: Take 600 mg of ibuprofen every 6 hours or 440 mg (over the counter dose) to 500 mg (prescription dose) of naproxen every 12 hours for the next 3 days. After this time, these medications may be used as needed for pain. Take these medications with food to avoid upset stomach. Choose only one of these medications, do not take them together. Acetaminophen (generic for Tylenol): Should you continue to have additional pain while taking the ibuprofen or naproxen, you may add in acetaminophen as needed. Your daily total maximum amount of acetaminophen from all sources should be limited to 4000mg /day for persons without liver problems, or 2000mg /day for those with liver problems.  Suture/staple removal: This should be  coordinated through the hand specialist.  Return to the ED sooner should the wound edges come apart or signs of infection arise, such as spreading redness, puffiness/swelling, pus draining from the wound, severe increase in pain, fever over 100.5F, or any other major issues.  For prescription assistance, may try using prescription discount sites or apps, such as goodrx.com

## 2019-04-21 NOTE — ED Triage Notes (Addendum)
Pt states that she was helping to feed her neighbor's dog this morning. Pt states that the dog was hungry and bit her at the base of her right thumb. Pt is also out of BP meds

## 2019-04-23 ENCOUNTER — Other Ambulatory Visit: Payer: Self-pay

## 2019-04-23 ENCOUNTER — Encounter (HOSPITAL_COMMUNITY): Payer: Self-pay

## 2019-04-23 ENCOUNTER — Emergency Department (HOSPITAL_COMMUNITY)
Admission: EM | Admit: 2019-04-23 | Discharge: 2019-04-23 | Disposition: A | Discharge status: Home / Self Care | Payer: BC Managed Care – PPO | Attending: Emergency Medicine | Admitting: Emergency Medicine

## 2019-04-23 DIAGNOSIS — I10 Essential (primary) hypertension: Secondary | ICD-10-CM | POA: Diagnosis not present

## 2019-04-23 DIAGNOSIS — F17210 Nicotine dependence, cigarettes, uncomplicated: Secondary | ICD-10-CM | POA: Insufficient documentation

## 2019-04-23 DIAGNOSIS — W540XXD Bitten by dog, subsequent encounter: Secondary | ICD-10-CM

## 2019-04-23 DIAGNOSIS — Z2914 Encounter for prophylactic rabies immune globin: Secondary | ICD-10-CM | POA: Insufficient documentation

## 2019-04-23 DIAGNOSIS — W540XXA Bitten by dog, initial encounter: Secondary | ICD-10-CM | POA: Insufficient documentation

## 2019-04-23 DIAGNOSIS — Z203 Contact with and (suspected) exposure to rabies: Secondary | ICD-10-CM

## 2019-04-23 DIAGNOSIS — Z79899 Other long term (current) drug therapy: Secondary | ICD-10-CM | POA: Diagnosis not present

## 2019-04-23 MED ORDER — RABIES VACCINE, PCEC IM SUSR
1.0000 mL | Freq: Once | INTRAMUSCULAR | Status: AC
  Administered 2019-04-23: 10:00:00 1 mL via INTRAMUSCULAR
  Filled 2019-04-23: qty 1

## 2019-04-23 NOTE — ED Provider Notes (Signed)
[facility]-EMERGENCY DEPT Provider Note   CSN: [id number] Arrival date & time: 04/23/19  9622    History   Chief Complaint Chief Complaint  Patient presents with  . needs rabies shot    HPI Diane Hunter is a 48 y.o. female past medical history significant for hypertension who presents for evaluation of needing rabies injection.  Patient had a dog bite which she sustained on Sunday, 2 days PTA.  Had loose sutures placed area.  She is follow-up appointment with hand surgery tomorrow morning at 830.  She denies fever, chills, nausea, vomiting, headache, chest pain, shortness of breath, abdominal pain, dizziness, redness, swelling, warmth, bleeding, drainage, paresthesias to wound.  Denies additional rating or alleviating factors  History obtained from patient and past medical records.  Interpreter used.     HPI  Past Medical History:  Diagnosis Date  . Bronchitis   . Hypertension     There are no active problems to display for this patient.   History reviewed. No pertinent surgical history.   OB History   No obstetric history on file.      Home Medications    Prior to Admission medications   Medication Sig Start Date End Date Taking? Authorizing Provider  amoxicillin-clavulanate (AUGMENTIN) 875-125 MG tablet Take 1 tablet by mouth every 12 (twelve) hours. 04/21/19   Joy, Shawn C, PA-C  aspirin-acetaminophen-caffeine (EXCEDRIN MIGRAINE) [phone] MG per tablet Take 2 tablets by mouth every 6 (six) hours as needed. migraine    [provider]  hydrocortisone 2.5 % cream Apply topically 2 (two) times daily. 03/03/15   Tysinger, Kermit Balo, PA-C  ibuprofen (ADVIL,MOTRIN) 800 MG tablet Take 1 tablet (800 mg total) by mouth 3 (three) times daily. 08/04/16   Fayrene Helper, PA-C  lisinopril-hydrochlorothiazide (ZESTORETIC) 20-12.5 MG tablet Take 1 tablet by mouth daily. 08/26/17   Bethann Berkshire, MD  lisinopril-hydrochlorothiazide (ZESTORETIC) 20-12.5 MG  tablet Take 1 tablet by mouth daily. 04/21/19 06/20/19  Joy, Shawn C, PA-C  traMADol (ULTRAM) 50 MG tablet Take 1 tablet (50 mg total) by mouth every 6 (six) hours as needed. 08/26/17   Bethann Berkshire, MD    Family History Family History  Problem Relation Age of Onset  . Lupus Mother   . Rheum arthritis Mother   . Cancer Other   . Diabetes Other   . Hypertension Other     Social History Social History   Tobacco Use  . Smoking status: Current Every Day Smoker    Packs/day: 0.25  . Smokeless tobacco: Never Used  Substance Use Topics  . Alcohol use: No  . Drug use: No     Allergies   Pork-derived products   Review of Systems Review of Systems  Constitutional: Negative.   HENT: Negative.   Respiratory: Negative.   Cardiovascular: Negative.   Gastrointestinal: Negative.   Genitourinary: Negative.   Musculoskeletal: Negative.   Skin: Positive for wound.  Neurological: Negative.   All other systems reviewed and are negative.    Physical Exam Updated Vital Signs BP (!) 185/100 Comment: PA is aware  Pulse 94   Temp 98.5 F (36.9 C) (Oral)   Resp 16   Ht 5\' 3"  (1.6 m)   Wt 74.8 kg   LMP 02/16/2019 (Approximate)   SpO2 100%   BMI 29.23 kg/m   Physical Exam Vitals signs and nursing note reviewed.  Constitutional:      General: She is not in acute distress.    Appearance: She is well-developed.  She is not ill-appearing, toxic-appearing or diaphoretic.  HENT:     Head: Normocephalic and atraumatic.     Nose: Nose normal.     Mouth/Throat:     Mouth: Mucous membranes are moist.     Pharynx: Oropharynx is clear.  Eyes:     Pupils: Pupils are equal, round, and reactive to light.  Neck:     Musculoskeletal: Normal range of motion.  Cardiovascular:     Rate and Rhythm: Normal rate.     Pulses: Normal pulses.     Heart sounds: Normal heart sounds.  Pulmonary:     Effort: Pulmonary effort is normal. No respiratory distress.     Breath sounds: Normal breath  sounds.  Abdominal:     General: Bowel sounds are normal. There is no distension.  Musculoskeletal: Normal range of motion.     Comments: Moves all 4 extremities without difficulty.  Patient able to abduct, abduct make okay sign with right upper extremity.  Skin:    General: Skin is warm and dry.     Capillary Refill: Capillary refill takes less than 2 seconds.     Comments: Laceration with sutures in place between first and second webspace on right upper extremity on right hand.  No bleeding, drainage noted.  No edema, erythema, ecchymosis or warmth.  Brisk capillary refill.  Neurological:     Mental Status: She is alert.     Comments: Intact sensation to extremities.      ED Treatments / Results  Labs (all labs ordered are listed, but only abnormal results are displayed) Labs Reviewed - No data to display  EKG None  Radiology Dg Hand Complete Right  Result Date: 04/21/2019 CLINICAL DATA:  Dog bite to base of thumb EXAM: RIGHT HAND - COMPLETE 3+ VIEW COMPARISON:  None. FINDINGS: No fracture or dislocation of the right hand. Joint spaces are well preserved. There is soft tissue laceration near the thumb metacarpophalangeal joint. No radiopaque foreign body noted. IMPRESSION: No fracture or dislocation of the right hand. There is soft tissue laceration near the thumb metacarpophalangeal joint. No radiopaque foreign body noted. Electronically Signed   By: Lauralyn PrimesAlex  Bibbey M.D.   On: 04/21/2019 12:03   Procedures Procedures (including critical care time)  Medications Ordered in ED Medications  rabies vaccine (RABAVERT) injection 1 mL (has no administration in time range)    Initial Impression / Assessment and Plan / ED Course  I have reviewed the triage vital signs and the nursing notes.  Pertinent labs & imaging results that were available during my care of the patient were reviewed by me and considered in my medical decision making (see chart for details).  48 year old female  appears otherwise well presents for evaluation of needing rabies vaccine.  Was seen on Sunday, 2 days PTA for dog bite.  Had a loose sutures placed.  She has follow-up with hand surgery tomorrow morning at 8:30 AM.  Has normal musculoskeletal exam.  She is neurovascularly intact.  No evidence of infectious process on exam.  Will give second dose in rabies series.  She is continuing to take Augmentin.  She was noted to be hypertensive on arrival.  She has history of hypertension.  She did recently take her HCTZ approximately 30 minutes PTA.  She denies headache, vision changes, dizziness, nausea, vomiting, chest pain, shortness of breath.  Discussed with patient follow-up with PCP for reevaluation of her blood pressure.  Her blood pressure actually seems improved from her prior visit.  Low suspicion for hypertensive urgency or emergency.  Patient to keep appointment with hand surgery tomorrow.  Return for any new worsening symptoms.        Final Clinical Impressions(s) / ED Diagnoses   Final diagnoses:  Dog bite, subsequent encounter  Need for post exposure prophylaxis for rabies  Essential hypertension    ED Discharge Orders    None       Henderly, Britni A, PA-C 04/23/19 5427    Virgel Manifold, MD 04/23/19 [phone]

## 2019-04-23 NOTE — Discharge Instructions (Signed)
Continue to take the antibiotics as prescribed.  Follow-up with your PCP for reevaluation of your elevated blood pressure.  Follow-up with hand surgery tomorrow.  Return for any new worsening symptoms.

## 2019-04-23 NOTE — ED Triage Notes (Signed)
Patient is here for Day 3 Rabies injection from a dog bite that occurred on Sunday,04/21/19.

## 2019-04-27 ENCOUNTER — Emergency Department (HOSPITAL_COMMUNITY)
Admission: EM | Admit: 2019-04-27 | Discharge: 2019-04-27 | Disposition: A | Discharge status: Home / Self Care | Payer: BC Managed Care – PPO | Attending: Emergency Medicine | Admitting: Emergency Medicine

## 2019-04-27 ENCOUNTER — Other Ambulatory Visit: Payer: Self-pay

## 2019-04-27 DIAGNOSIS — I10 Essential (primary) hypertension: Secondary | ICD-10-CM | POA: Insufficient documentation

## 2019-04-27 DIAGNOSIS — F17200 Nicotine dependence, unspecified, uncomplicated: Secondary | ICD-10-CM | POA: Diagnosis not present

## 2019-04-27 DIAGNOSIS — S61411D Laceration without foreign body of right hand, subsequent encounter: Secondary | ICD-10-CM | POA: Diagnosis present

## 2019-04-27 DIAGNOSIS — Z23 Encounter for immunization: Secondary | ICD-10-CM

## 2019-04-27 DIAGNOSIS — W540XXD Bitten by dog, subsequent encounter: Secondary | ICD-10-CM | POA: Diagnosis not present

## 2019-04-27 MED ORDER — RABIES VACCINE, PCEC IM SUSR
1.0000 mL | Freq: Once | INTRAMUSCULAR | Status: AC
  Administered 2019-04-27: 1 mL via INTRAMUSCULAR
  Filled 2019-04-27: qty 1

## 2019-04-27 NOTE — ED Provider Notes (Signed)
[facility] DEPT Provider Note   CSN: [id number] Arrival date & time: 04/27/19  1151     History   Chief Complaint Chief Complaint  Patient presents with  . Rabies Injection    HPI Diane Hunter is a 48 y.o. female.     HPI   48 year old female presents requesting rabies vaccine.  Patient was seen originally on 10/4 for a dog bite to the right hand.  She had loose sutures placed.  She had rabies vaccine started.  She was seen again on 10/6 for a repeat rabies vaccine.  She saw the hand surgeon on 10/7 who stated everything looked good and she did not require any surgery or intervention.  She is still taking her Augmentin as prescribed.  She denies any drainage of pus, pain.  Patient noted to be hypertensive which she states is chronic and she denies any symptoms associated with hypertension including chest pain, shortness of breath, vomiting, lightheadedness or dizziness.  Past Medical History:  Diagnosis Date  . Bronchitis   . Hypertension     There are no active problems to display for this patient.   No past surgical history on file.   OB History   No obstetric history on file.      Home Medications    Prior to Admission medications   Medication Sig Start Date End Date Taking? Authorizing Provider  amoxicillin-clavulanate (AUGMENTIN) 875-125 MG tablet Take 1 tablet by mouth every 12 (twelve) hours. 04/21/19   Joy, Shawn C, PA-C  aspirin-acetaminophen-caffeine (EXCEDRIN MIGRAINE) [phone] MG per tablet Take 2 tablets by mouth every 6 (six) hours as needed. migraine    [provider]  hydrocortisone 2.5 % cream Apply topically 2 (two) times daily. 03/03/15   Tysinger, Camelia Eng, PA-C  ibuprofen (ADVIL,MOTRIN) 800 MG tablet Take 1 tablet (800 mg total) by mouth 3 (three) times daily. 08/04/16   Domenic Moras, PA-C  lisinopril-hydrochlorothiazide (ZESTORETIC) 20-12.5 MG tablet Take 1 tablet by mouth daily. 08/26/17   Milton Ferguson, MD   lisinopril-hydrochlorothiazide (ZESTORETIC) 20-12.5 MG tablet Take 1 tablet by mouth daily. 04/21/19 06/20/19  Joy, Shawn C, PA-C  traMADol (ULTRAM) 50 MG tablet Take 1 tablet (50 mg total) by mouth every 6 (six) hours as needed. 08/26/17   Milton Ferguson, MD    Family History Family History  Problem Relation Age of Onset  . Lupus Mother   . Rheum arthritis Mother   . Cancer Other   . Diabetes Other   . Hypertension Other     Social History Social History   Tobacco Use  . Smoking status: Current Every Day Smoker    Packs/day: 0.25  . Smokeless tobacco: Never Used  Substance Use Topics  . Alcohol use: No  . Drug use: No     Allergies   Pork-derived products   Review of Systems Review of Systems  Constitutional: Negative for chills and fever.  Respiratory: Negative for shortness of breath.   Cardiovascular: Negative for chest pain.  Gastrointestinal: Negative for abdominal pain, nausea and vomiting.  Skin: Positive for wound (right han).     Physical Exam Updated Vital Signs BP (!) 182/97   Pulse 70   Temp 98.3 F (36.8 C) (Oral)   Resp 17   SpO2 100%   Physical Exam Vitals signs and nursing note reviewed.  Constitutional:      Appearance: She is well-developed.  HENT:     Head: Normocephalic and atraumatic.  Eyes:  Conjunctiva/sclera: Conjunctivae normal.  Neck:     Musculoskeletal: Neck supple.  Cardiovascular:     Rate and Rhythm: Normal rate and regular rhythm.     Heart sounds: Normal heart sounds. No murmur.  Pulmonary:     Effort: Pulmonary effort is normal. No respiratory distress.     Breath sounds: Normal breath sounds. No wheezing or rales.  Abdominal:     General: Bowel sounds are normal. There is no distension.     Palpations: Abdomen is soft.     Tenderness: There is no abdominal tenderness.  Musculoskeletal: Normal range of motion.        General: No tenderness or deformity.       Hands:  Skin:    General: Skin is warm and dry.      Findings: No erythema or rash.  Neurological:     Mental Status: She is alert and oriented to person, place, and time.  Psychiatric:        Behavior: Behavior normal.      ED Treatments / Results  Labs (all labs ordered are listed, but only abnormal results are displayed) Labs Reviewed - No data to display  EKG None  Radiology No results found.  Procedures Procedures (including critical care time)  Medications Ordered in ED Medications  rabies vaccine (RABAVERT) injection 1 mL (has no administration in time range)     Initial Impression / Assessment and Plan / ED Course  I have reviewed the triage vital signs and the nursing notes.  Pertinent labs & imaging results that were available during my care of the patient were reviewed by me and considered in my medical decision making (see chart for details).        Patient presents requesting rabies vaccine.  This is her third vaccine.  Her laceration to the right hand is very well-appearing, no surrounding erythema, no crepitus, no discharge.  She has seen by the hand surgeon and states it was reassuring exam.  She is continuing to take her Augmentin.  We will give her third rabies vaccine today.  She was instructed to follow-up with urgent care for her fourth rabies vaccine but declined stating that she will come to the ER.  She does not want to go to the urgent care for her rabies vaccine.  Patient ready and stable for discharge.   At this time there does not appear to be any evidence of an acute emergency medical condition and the patient appears stable for discharge with appropriate outpatient follow up.Diagnosis was discussed with patient who verbalizes understanding and is agreeable to discharge.  Final Clinical Impressions(s) / ED Diagnoses   Final diagnoses:  None    ED Discharge Orders    None       Rueben Bash 04/27/19 1701    Lorre Nick, MD 04/28/19 1406

## 2019-04-27 NOTE — ED Triage Notes (Signed)
Patient is here for rabies shots. Patient reports the dog bite occurred 10/4.  Patient is alert and oriented and in no active distress

## 2019-04-27 NOTE — Discharge Instructions (Signed)
Please finish your Augmentin as previously prescribed.  Please return for your fourth and final rabies vaccine.  You may also report to the urgent care for this.  Return to the ED immediately for new or worsening symptoms or concerns, such as redness, discharge from the wound, swelling, fevers or any concerns at all.

## 2020-06-20 ENCOUNTER — Encounter (HOSPITAL_COMMUNITY): Payer: Self-pay | Admitting: *Deleted

## 2020-06-20 ENCOUNTER — Emergency Department (HOSPITAL_COMMUNITY)
Admission: EM | Admit: 2020-06-20 | Discharge: 2020-06-20 | Disposition: A | Discharge status: Home / Self Care | Payer: BC Managed Care – PPO | Attending: Emergency Medicine | Admitting: Emergency Medicine

## 2020-06-20 ENCOUNTER — Emergency Department (HOSPITAL_COMMUNITY): Payer: BC Managed Care – PPO

## 2020-06-20 ENCOUNTER — Other Ambulatory Visit: Payer: Self-pay

## 2020-06-20 DIAGNOSIS — J0100 Acute maxillary sinusitis, unspecified: Secondary | ICD-10-CM | POA: Diagnosis not present

## 2020-06-20 DIAGNOSIS — J069 Acute upper respiratory infection, unspecified: Secondary | ICD-10-CM | POA: Insufficient documentation

## 2020-06-20 DIAGNOSIS — F17200 Nicotine dependence, unspecified, uncomplicated: Secondary | ICD-10-CM | POA: Diagnosis not present

## 2020-06-20 DIAGNOSIS — I160 Hypertensive urgency: Secondary | ICD-10-CM | POA: Insufficient documentation

## 2020-06-20 DIAGNOSIS — I10 Essential (primary) hypertension: Secondary | ICD-10-CM | POA: Insufficient documentation

## 2020-06-20 DIAGNOSIS — R059 Cough, unspecified: Secondary | ICD-10-CM | POA: Diagnosis present

## 2020-06-20 DIAGNOSIS — Z79899 Other long term (current) drug therapy: Secondary | ICD-10-CM | POA: Insufficient documentation

## 2020-06-20 LAB — CBC WITH DIFFERENTIAL/PLATELET
Abs Immature Granulocytes: 0.02 10*3/uL (ref 0.00–0.07)
Basophils Absolute: 0 10*3/uL (ref 0.0–0.1)
Basophils Relative: 1 %
Eosinophils Absolute: 0.2 10*3/uL (ref 0.0–0.5)
Eosinophils Relative: 3 %
HCT: 44.1 % (ref 36.0–46.0)
Hemoglobin: 14.7 g/dL (ref 12.0–15.0)
Immature Granulocytes: 0 %
Lymphocytes Relative: 34 %
Lymphs Abs: 2.1 10*3/uL (ref 0.7–4.0)
MCH: 33.2 pg (ref 26.0–34.0)
MCHC: 33.3 g/dL (ref 30.0–36.0)
MCV: 99.5 fL (ref 80.0–100.0)
Monocytes Absolute: 0.5 10*3/uL (ref 0.1–1.0)
Monocytes Relative: 7 %
Neutro Abs: 3.3 10*3/uL (ref 1.7–7.7)
Neutrophils Relative %: 55 %
Platelets: 220 10*3/uL (ref 150–400)
RBC: 4.43 MIL/uL (ref 3.87–5.11)
RDW: 12.2 % (ref 11.5–15.5)
WBC: 6.2 10*3/uL (ref 4.0–10.5)
nRBC: 0 % (ref 0.0–0.2)

## 2020-06-20 LAB — BASIC METABOLIC PANEL
Anion gap: 10 (ref 5–15)
BUN: 13 mg/dL (ref 6–20)
CO2: 24 mmol/L (ref 22–32)
Calcium: 8.8 mg/dL — ABNORMAL LOW (ref 8.9–10.3)
Chloride: 101 mmol/L (ref 98–111)
Creatinine, Ser: 0.87 mg/dL (ref 0.44–1.00)
GFR, Estimated: >60 mL/min (ref >60)
Glucose, Bld: 96 mg/dL (ref 70–99)
Potassium: 3.9 mmol/L (ref 3.5–5.1)
Sodium: 135 mmol/L (ref 135–145)

## 2020-06-20 LAB — POC URINE PREG, ED: Preg Test, Ur: NEGATIVE

## 2020-06-20 MED ORDER — BENZONATATE 100 MG PO CAPS: 100.0000 mg | ORAL_CAPSULE | Freq: Three times a day (TID) | ORAL | 0 refills | Status: AC

## 2020-06-20 MED ORDER — AMLODIPINE BESYLATE 5 MG PO TABS: 5.0000 mg | ORAL_TABLET | Freq: Every day | ORAL | 0 refills | Status: DC

## 2020-06-20 MED ORDER — AMLODIPINE BESYLATE 5 MG PO TABS: 5.0000 mg | ORAL_TABLET | Freq: Once | ORAL | Status: DC

## 2020-06-20 MED ORDER — ACETAMINOPHEN 325 MG PO TABS
650.0000 mg | ORAL_TABLET | Freq: Once | ORAL | Status: DC
  Filled 2020-06-20: qty 2

## 2020-06-20 MED ORDER — AMOXICILLIN-POT CLAVULANATE 875-125 MG PO TABS: 1.0000 | ORAL_TABLET | Freq: Two times a day (BID) | ORAL | 0 refills | Status: AC

## 2020-06-20 MED ORDER — AMOXICILLIN-POT CLAVULANATE 875-125 MG PO TABS: 1.0000 | ORAL_TABLET | Freq: Two times a day (BID) | ORAL | 0 refills | Status: DC

## 2020-06-20 MED ORDER — FLUTICASONE PROPIONATE 50 MCG/ACT NA SUSP: 1.0000 | Freq: Every day | NASAL | 2 refills | Status: AC

## 2020-06-20 MED ORDER — BENZONATATE 100 MG PO CAPS: 100.0000 mg | ORAL_CAPSULE | Freq: Three times a day (TID) | ORAL | 0 refills | Status: DC

## 2020-06-20 MED ORDER — FLUTICASONE PROPIONATE 50 MCG/ACT NA SUSP: 1.0000 | Freq: Every day | NASAL | 2 refills | Status: DC

## 2020-06-20 NOTE — Discharge Instructions (Addendum)
It is important for you to establish care with a primary care provider.  You can follow-up listed below. Your blood pressure was high in the ER today.  Make sure you take the blood pressure medication prescribed to you.  Ultimately you will need to follow-up with your primary care provider to see if this medication is helping you. Take the other medications to help with your symptoms. Monitor your blood pressure at home.  You can pick up the omron 3 series blood pressure cuff  from [facility] and other pharmacies. Return to the ER if you start to experience worsening cough, you develop chest pain, shortness of breath, headache, blurry vision, numbness in arms or legs.

## 2020-06-20 NOTE — ED Provider Notes (Signed)
[facility]-EMERGENCY DEPT Provider Note   CSN: [id number] Arrival date & time: 06/20/20  9509     History Chief Complaint  Patient presents with  . Nasal Congestion  . Cough    Diane Hunter is a 49 y.o. female with a past medical history of hypertension, bronchitis presenting to the ED with a chief complaint of nasal congestion and cough.  Reports sinus pain and pressure.  Symptoms began about 8 days ago.  Cough will be intermittently productive with mucus but denies any hemoptysis.  No chest pain, shortness of breath, ear pain, vomiting, body aches or fever.  No sick contacts with similar symptoms.  HPI     Past Medical History:  Diagnosis Date  . Bronchitis   . Hypertension     There are no problems to display for this patient.   History reviewed. No pertinent surgical history.   OB History   No obstetric history on file.     Family History  Problem Relation Age of Onset  . Lupus Mother   . Rheum arthritis Mother   . Cancer Other   . Diabetes Other   . Hypertension Other     Social History   Tobacco Use  . Smoking status: Current Every Day Smoker    Packs/day: 0.25  . Smokeless tobacco: Never Used  Vaping Use  . Vaping Use: Never used  Substance Use Topics  . Alcohol use: No  . Drug use: No    Home Medications Prior to Admission medications   Medication Sig Start Date End Date Taking? Authorizing Provider  amLODipine (NORVASC) 5 MG tablet Take 1 tablet (5 mg total) by mouth daily. 06/20/20   Jai Bear, PA-C  amoxicillin-clavulanate (AUGMENTIN) 875-125 MG tablet Take 1 tablet by mouth every 12 (twelve) hours for 10 days. 06/20/20 06/30/20  Dietrich Pates, PA-C  aspirin-acetaminophen-caffeine (EXCEDRIN MIGRAINE) [phone] MG per tablet Take 2 tablets by mouth every 6 (six) hours as needed. migraine    [provider]  benzonatate (TESSALON) 100 MG capsule Take 1 capsule (100 mg total) by mouth every 8 (eight) hours. 06/20/20    Ashiya Kinkead, PA-C  fluticasone (FLONASE) 50 MCG/ACT nasal spray Place 1 spray into both nostrils daily. 06/20/20   Daney Moor, PA-C  hydrocortisone 2.5 % cream Apply topically 2 (two) times daily. 03/03/15   Tysinger, Kermit Balo, PA-C  ibuprofen (ADVIL,MOTRIN) 800 MG tablet Take 1 tablet (800 mg total) by mouth 3 (three) times daily. 08/04/16   Fayrene Helper, PA-C  lisinopril-hydrochlorothiazide (ZESTORETIC) 20-12.5 MG tablet Take 1 tablet by mouth daily. 08/26/17   Bethann Berkshire, MD  lisinopril-hydrochlorothiazide (ZESTORETIC) 20-12.5 MG tablet Take 1 tablet by mouth daily. 04/21/19 06/20/19  Joy, Shawn C, PA-C  traMADol (ULTRAM) 50 MG tablet Take 1 tablet (50 mg total) by mouth every 6 (six) hours as needed. 08/26/17   Bethann Berkshire, MD    Allergies    Pork-derived products  Review of Systems   Review of Systems  Constitutional: Negative for chills and fever.  HENT: Positive for congestion, sinus pressure and sinus pain. Negative for ear pain and sore throat.   Respiratory: Positive for cough. Negative for shortness of breath.   Cardiovascular: Negative for chest pain.    Physical Exam Updated Vital Signs BP (!) 151/96 (BP Location: Right Arm)   Pulse 97   Temp 98.6 F (37 C) (Oral)   Resp 16   Ht 5\' 3"  (1.6 m)   Wt 77.1 kg   LMP  05/18/2020   SpO2 100%   BMI 30.11 kg/m   Physical Exam Vitals and nursing note reviewed.  Constitutional:      General: She is not in acute distress.    Appearance: She is well-developed. She is not diaphoretic.  HENT:     Head: Normocephalic and atraumatic.     Nose:     Right Sinus: Maxillary sinus tenderness present.     Left Sinus: Maxillary sinus tenderness present.  Eyes:     General: No scleral icterus.    Conjunctiva/sclera: Conjunctivae normal.  Cardiovascular:     Rate and Rhythm: Normal rate and regular rhythm.     Heart sounds: Normal heart sounds.  Pulmonary:     Effort: Pulmonary effort is normal. No respiratory distress.      Breath sounds: Normal breath sounds.  Musculoskeletal:     Cervical back: Normal range of motion.  Skin:    Findings: No rash.  Neurological:     Mental Status: She is alert.     ED Results / Procedures / Treatments   Labs (all labs ordered are listed, but only abnormal results are displayed) Labs Reviewed  BASIC METABOLIC PANEL - Abnormal; Notable for the following components:      Result Value   Calcium 8.8 (*)    All other components within normal limits  CBC WITH DIFFERENTIAL/PLATELET  POC URINE PREG, ED    EKG None  Radiology DG Chest 2 View  Result Date: 06/20/2020 CLINICAL DATA:  Nasal congestion, productive cough for 2-3 days. EXAM: CHEST - 2 VIEW COMPARISON:  None. FINDINGS: Heart size and mediastinal contours are within normal limits. Lungs are clear. No pleural effusion. Moderate scoliosis of the thoracic spine. No acute appearing osseous abnormality. IMPRESSION: No active cardiopulmonary disease.  No evidence of pneumonia. Electronically Signed   By: Bary Richard M.D.   On: 06/20/2020 10:30    Procedures Procedures (including critical care time)  Medications Ordered in ED Medications  acetaminophen (TYLENOL) tablet 650 mg (650 mg Oral Not Given 06/20/20 1100)    ED Course  I have reviewed the triage vital signs and the nursing notes.  Pertinent labs & imaging results that were available during my care of the patient were reviewed by me and considered in my medical decision making (see chart for details).  Clinical Course as of Jun 21 1303  Sat Jun 20, 2020  8921 Patient states that she was prescribed lisinopril by her PCP in the past.  She was concerned that it was not working for her so she told her PCP to increase the dose but he did not.  She has not seen a PCP recently is and is in the process of obtaining a new PCP.  She is asymptomatic at this time. Will recheck.  BP(!): 199/121 [HK]  1113 Blood pressure increasing here.  Will check baseline lab work  and have discussion with patient regarding restarting her antihypertensive.  BP(!): 208/108 [HK]  1254 Preg Test, Ur: NEGATIVE [HK]  1254 Creatinine: 0.87 [HK]    Clinical Course User Index [HK] Dietrich Pates, PA-C   MDM Rules/Calculators/A&P                          49 year old female with past medical history of hypertension presenting to the ED with a chief complaint of sinus pressure and cough.  Symptoms began 8 days ago.  Cough is now intermittently productive.  No hemoptysis.  No chest  pain, shortness of breath or fever.  Patient has sinus tenderness on exam.  Lungs are clear.  Initial tachycardia improved. CXR shows no acute findings.  Patient remains hypertensive here up to 208 systolic.  Though she is asymptomatic I feel she will benefit from initiating her antihypertensives.  She does not want to be on lisinopril as she feels that this did not help her.  Baseline lab work including CBC and BMP are unremarkable.  Pregnancy test is negative.  We will start her on amlodipine 5mg  and encouraged her to establish with a primary care provider and monitor her blood pressures at home.  She is in the process of following up with the primary care provider which I urged her to do as they will need to determine whether this medication is helping her.  Patient's blood pressure initially elevated in the emergency department today. Patient denies headache, change in vision, numbness, weakness, chest pain, dyspnea, dizziness, or lightheadedness therefore doubt hypertensive emergency. Discussed elevated blood pressure with the patient and the need for primary care follow up with potential need to initiate or change antihypertensive medications and or for further evaluation. Discussed return precaution signs/symptoms for hypertensive emergency as listed above with the patient.  Will also treat symptomatically for sinusitis.   Patient is hemodynamically stable, in NAD, and able to ambulate in the ED. Evaluation  does not show pathology that would require ongoing emergent intervention or inpatient treatment. I explained the diagnosis to the patient. Pain has been managed and has no complaints prior to discharge. Patient is comfortable with above plan and is stable for discharge at this time. All questions were answered prior to disposition. Strict return precautions for returning to the ED were discussed. Encouraged follow up with PCP.   An After Visit Summary was printed and given to the patient.   Portions of this note were generated with . Dictation errors may occur despite best attempts at proofreading.  Final Clinical Impression(s) / ED Diagnoses Final diagnoses:  Upper respiratory tract infection, unspecified type  Acute non-recurrent maxillary sinusitis  Asymptomatic hypertensive urgency    Rx / DC Orders ED Discharge Orders         Ordered    benzonatate (TESSALON) 100 MG capsule  Every 8 hours,   Status:  Discontinued        06/20/20 1115    amoxicillin-clavulanate (AUGMENTIN) 875-125 MG tablet  Every 12 hours,   Status:  Discontinued        06/20/20 1115    fluticasone (FLONASE) 50 MCG/ACT nasal spray  Daily,   Status:  Discontinued        06/20/20 1115    amLODipine (NORVASC) 5 MG tablet  Daily,   Status:  Discontinued        06/20/20 1255    amLODipine (NORVASC) 5 MG tablet  Daily        06/20/20 1304    amoxicillin-clavulanate (AUGMENTIN) 875-125 MG tablet  Every 12 hours        06/20/20 1304    benzonatate (TESSALON) 100 MG capsule  Every 8 hours        06/20/20 1304    fluticasone (FLONASE) 50 MCG/ACT nasal spray  Daily        06/20/20 1304           14/04/21, PA-C 06/20/20 1304    14/04/21, MD 06/21/20 [phone]

## 2020-06-20 NOTE — ED Triage Notes (Signed)
Pt states she has had nasal congestion since last Sunday, Now has a cough that is productive at times.

## 2020-10-29 ENCOUNTER — Ambulatory Visit (INDEPENDENT_AMBULATORY_CARE_PROVIDER_SITE_OTHER): Payer: BC Managed Care – PPO | Admitting: Primary Care

## 2021-01-11 ENCOUNTER — Ambulatory Visit (INDEPENDENT_AMBULATORY_CARE_PROVIDER_SITE_OTHER): Payer: BC Managed Care – PPO | Admitting: Primary Care

## 2022-03-08 IMAGING — CR DG CHEST 2V
2 series · 2 of 2 positions shown · non-contrast
Comparison: None.

CLINICAL DATA: Nasal congestion, productive cough for 2-3 days.

EXAM:
CHEST - 2 VIEW

[w chest pa]
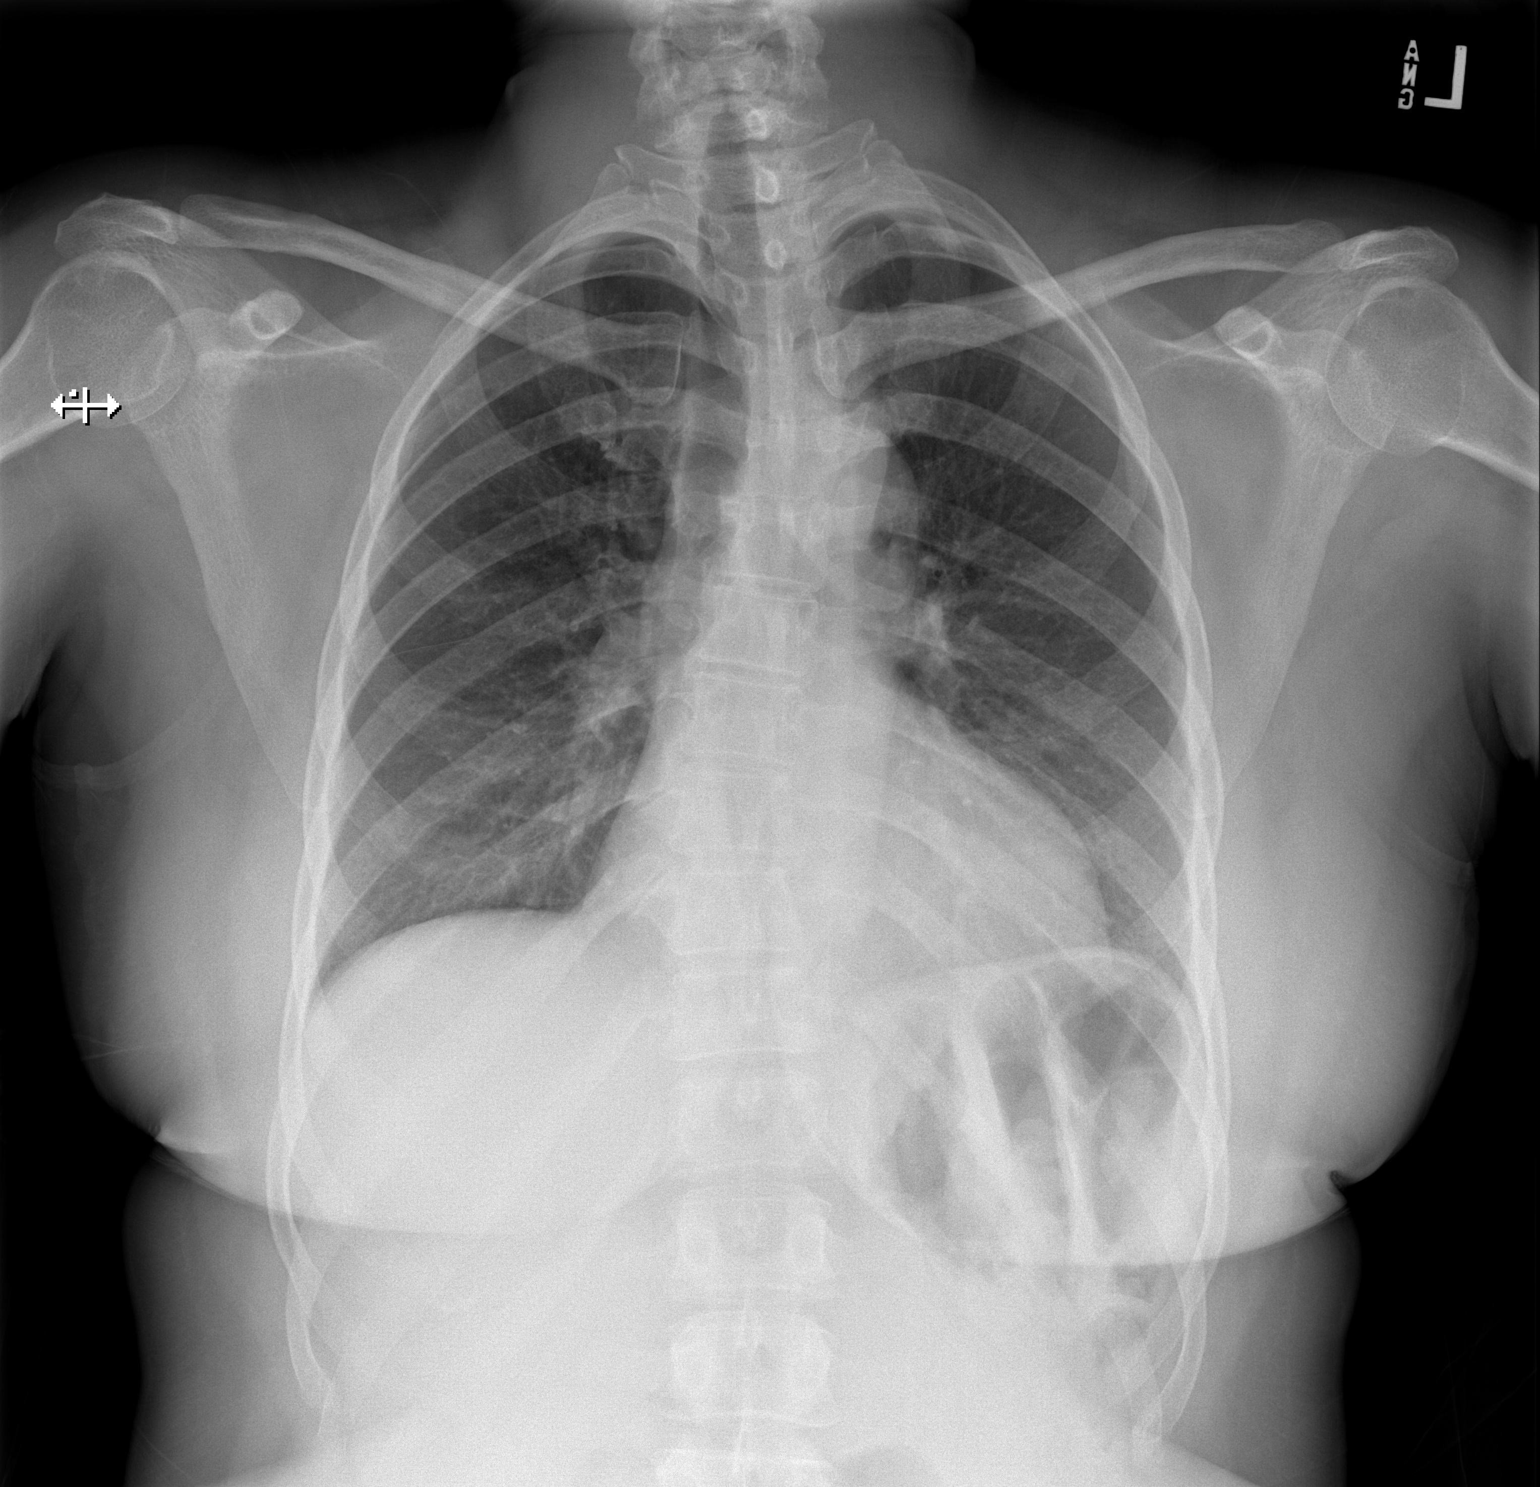

[w chest lat]
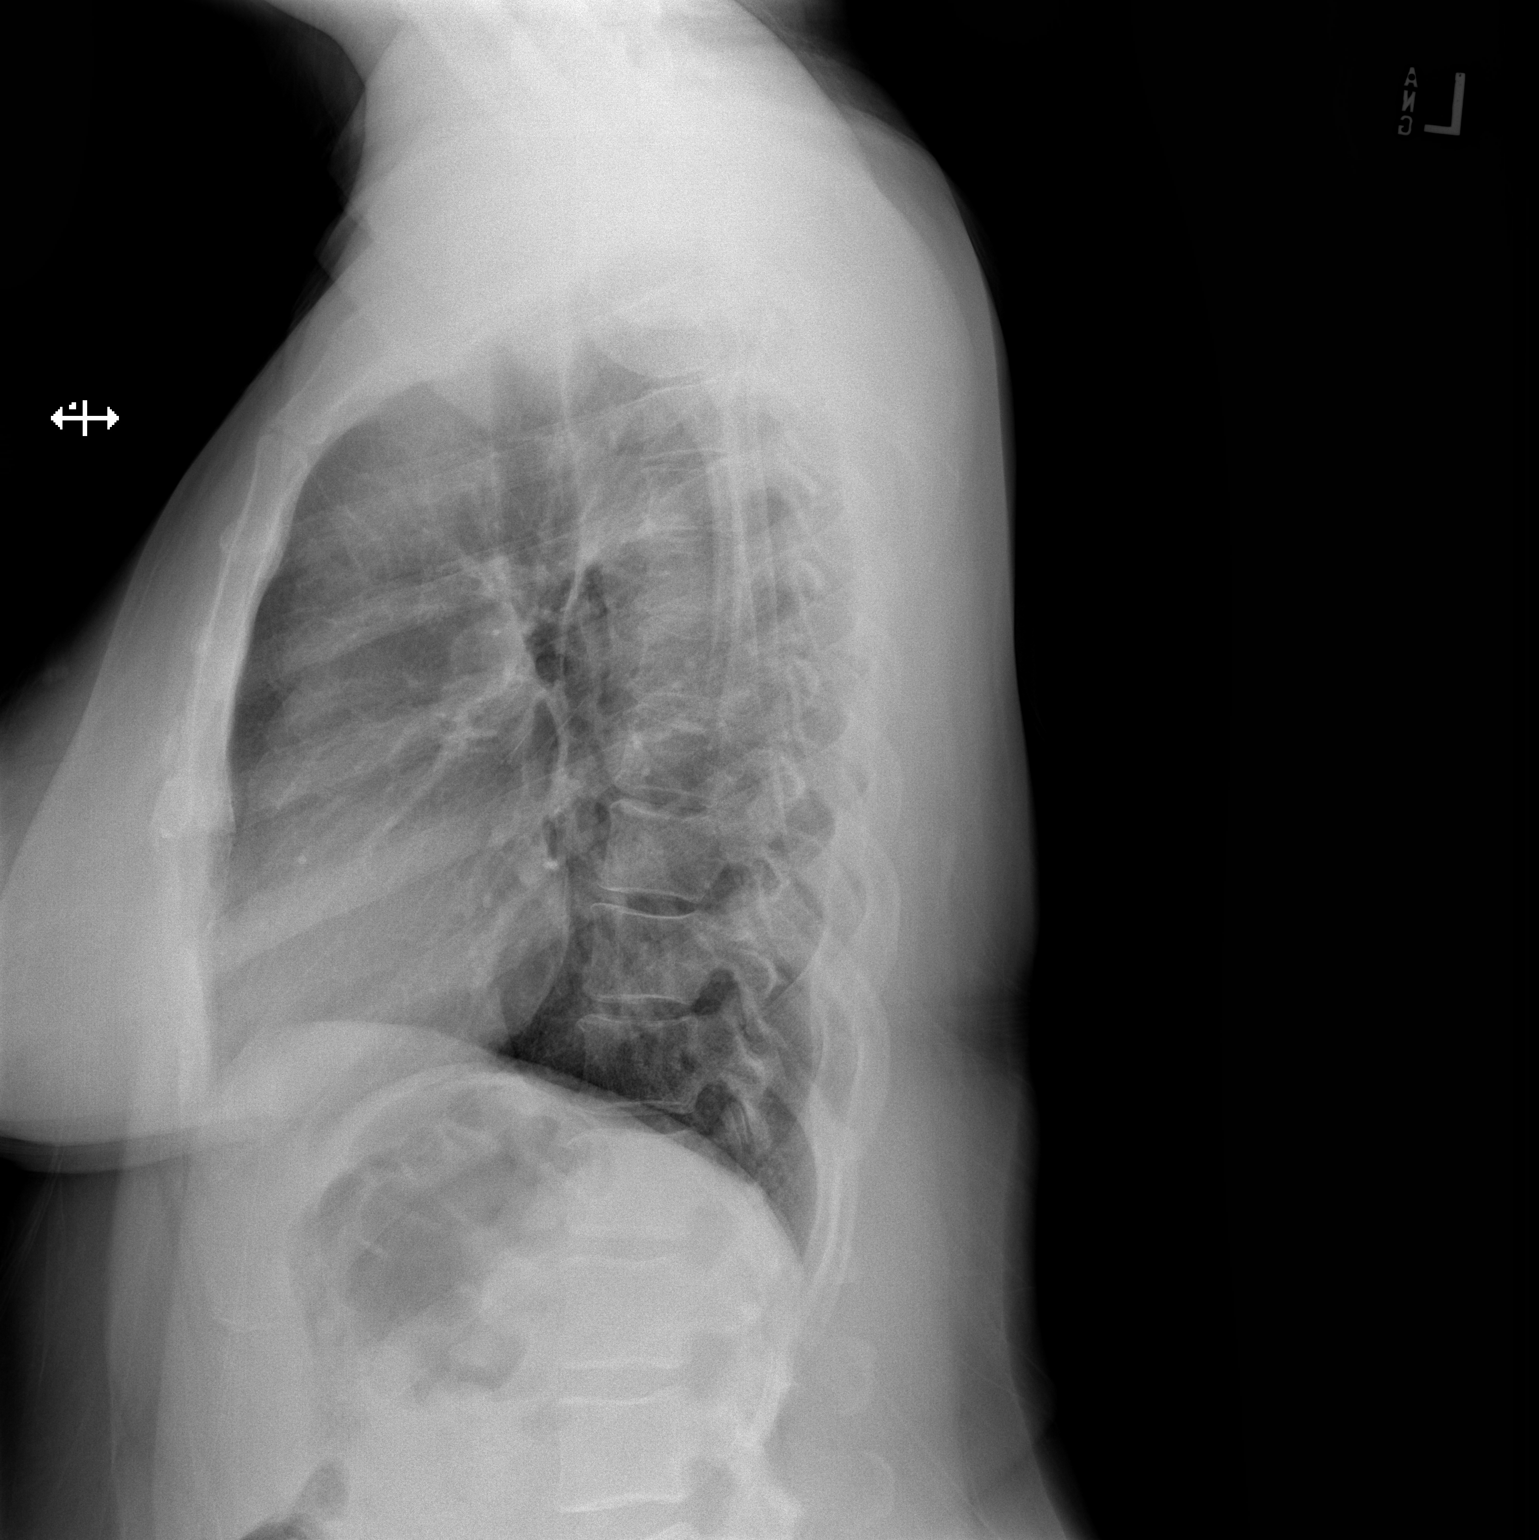

[2 of 2 positions shown; findings below may reference images not displayed]

FINDINGS: Heart size and mediastinal contours are within normal limits. Lungs
are clear. No pleural effusion. Moderate scoliosis of the thoracic
spine. No acute appearing osseous abnormality.
IMPRESSION: No active cardiopulmonary disease.  No evidence of pneumonia.

## 2022-10-03 ENCOUNTER — Emergency Department (HOSPITAL_COMMUNITY): Payer: BC Managed Care – PPO

## 2022-10-03 ENCOUNTER — Other Ambulatory Visit: Payer: Self-pay

## 2022-10-03 ENCOUNTER — Encounter (HOSPITAL_COMMUNITY): Payer: Self-pay

## 2022-10-03 ENCOUNTER — Emergency Department (HOSPITAL_COMMUNITY)
Admission: EM | Admit: 2022-10-03 | Discharge: 2022-10-03 | Disposition: A | Discharge status: Home / Self Care | Payer: BC Managed Care – PPO | Attending: Emergency Medicine | Admitting: Emergency Medicine

## 2022-10-03 DIAGNOSIS — Z79899 Other long term (current) drug therapy: Secondary | ICD-10-CM | POA: Insufficient documentation

## 2022-10-03 DIAGNOSIS — Z5329 Procedure and treatment not carried out because of patient's decision for other reasons: Secondary | ICD-10-CM | POA: Diagnosis not present

## 2022-10-03 DIAGNOSIS — Z7982 Long term (current) use of aspirin: Secondary | ICD-10-CM | POA: Diagnosis not present

## 2022-10-03 DIAGNOSIS — E876 Hypokalemia: Secondary | ICD-10-CM

## 2022-10-03 DIAGNOSIS — I160 Hypertensive urgency: Secondary | ICD-10-CM | POA: Diagnosis present

## 2022-10-03 DIAGNOSIS — I10 Essential (primary) hypertension: Secondary | ICD-10-CM | POA: Insufficient documentation

## 2022-10-03 LAB — URINALYSIS, ROUTINE W REFLEX MICROSCOPIC
Bilirubin Urine: NEGATIVE
Glucose, UA: NEGATIVE mg/dL
Hgb urine dipstick: NEGATIVE
Ketones, ur: NEGATIVE mg/dL
Leukocytes,Ua: NEGATIVE
Nitrite: NEGATIVE
Protein, ur: NEGATIVE mg/dL
Specific Gravity, Urine: 1.005 (ref 1.005–1.030)
pH: 7 (ref 5.0–8.0)

## 2022-10-03 LAB — CBC WITH DIFFERENTIAL/PLATELET
Abs Immature Granulocytes: 0.01 10*3/uL (ref 0.00–0.07)
Basophils Absolute: 0 10*3/uL (ref 0.0–0.1)
Basophils Relative: 1 %
Eosinophils Absolute: 0.2 10*3/uL (ref 0.0–0.5)
Eosinophils Relative: 3 %
HCT: 43.9 % (ref 36.0–46.0)
Hemoglobin: 14.7 g/dL (ref 12.0–15.0)
Immature Granulocytes: 0 %
Lymphocytes Relative: 49 %
Lymphs Abs: 2.3 10*3/uL (ref 0.7–4.0)
MCH: 32.5 pg (ref 26.0–34.0)
MCHC: 33.5 g/dL (ref 30.0–36.0)
MCV: 97.1 fL (ref 80.0–100.0)
Monocytes Absolute: 0.3 10*3/uL (ref 0.1–1.0)
Monocytes Relative: 5 %
Neutro Abs: 2 10*3/uL (ref 1.7–7.7)
Neutrophils Relative %: 42 %
Platelets: 221 10*3/uL (ref 150–400)
RBC: 4.52 MIL/uL (ref 3.87–5.11)
RDW: 13 % (ref 11.5–15.5)
WBC: 4.8 10*3/uL (ref 4.0–10.5)
nRBC: 0 % (ref 0.0–0.2)

## 2022-10-03 LAB — BASIC METABOLIC PANEL
Anion gap: 6 (ref 5–15)
BUN: 13 mg/dL (ref 6–20)
CO2: 26 mmol/L (ref 22–32)
Calcium: 8.8 mg/dL — ABNORMAL LOW (ref 8.9–10.3)
Chloride: 102 mmol/L (ref 98–111)
Creatinine, Ser: 0.92 mg/dL (ref 0.44–1.00)
GFR, Estimated: >60 mL/min (ref >60)
Glucose, Bld: 102 mg/dL — ABNORMAL HIGH (ref 70–99)
Potassium: 3.2 mmol/L — ABNORMAL LOW (ref 3.5–5.1)
Sodium: 134 mmol/L — ABNORMAL LOW (ref 135–145)

## 2022-10-03 LAB — TROPONIN I (HIGH SENSITIVITY)
Troponin I (High Sensitivity): 6 ng/L (ref <18)
Troponin I (High Sensitivity): 7 ng/L (ref <18)

## 2022-10-03 MED ORDER — HYDROCHLOROTHIAZIDE 25 MG PO TABS: 12.5000 mg | ORAL_TABLET | Freq: Every day | ORAL | 0 refills | Status: AC

## 2022-10-03 MED ORDER — POTASSIUM CHLORIDE CRYS ER 20 MEQ PO TBCR
40.0000 meq | EXTENDED_RELEASE_TABLET | Freq: Once | ORAL | Status: AC
  Administered 2022-10-03: 40 meq via ORAL
  Filled 2022-10-03: qty 2

## 2022-10-03 MED ORDER — AMLODIPINE BESYLATE 5 MG PO TABS
5.0000 mg | ORAL_TABLET | Freq: Once | ORAL | Status: AC
  Administered 2022-10-03: 5 mg via ORAL
  Filled 2022-10-03: qty 1

## 2022-10-03 MED ORDER — AMLODIPINE BESYLATE 5 MG PO TABS: 5.0000 mg | ORAL_TABLET | Freq: Every day | ORAL | 0 refills | Status: AC

## 2022-10-03 MED ORDER — AMLODIPINE BESYLATE 5 MG PO TABS: 5.0000 mg | ORAL_TABLET | Freq: Every day | ORAL | 0 refills | Status: DC

## 2022-10-03 MED ORDER — HYDROCHLOROTHIAZIDE 12.5 MG PO TABS
12.5000 mg | ORAL_TABLET | Freq: Once | ORAL | Status: AC
  Administered 2022-10-03: 12.5 mg via ORAL
  Filled 2022-10-03: qty 1

## 2022-10-03 MED ORDER — HYDROCHLOROTHIAZIDE 25 MG PO TABS: 12.5000 mg | ORAL_TABLET | Freq: Every day | ORAL | 0 refills | Status: DC

## 2022-10-03 NOTE — ED Notes (Signed)
Patient transported to X-ray 

## 2022-10-03 NOTE — Discharge Instructions (Signed)
You were seen in the emergency department today for elevated blood pressure.   It is very important that you take all of your medications as prescribed, every day and the new blood pressure medications.  Please follow up with your doctor in the next 2-3 days and make an appointment with cardiology to discuss your elevated blood pressures and your current medication regimen.  Return to the emergency department if you develop any sudden/severe headache, confusion, slurred speech, worsening vision, weakness or numbness of any arm or leg, or for any chest pain, pressure, or trouble breathing.

## 2022-10-03 NOTE — ED Triage Notes (Addendum)
Pt seen at PCP for eval for antihypertensives. Systolic [id number] at PCP. Pt also wants to be evaluated for a bladder infection.

## 2022-10-03 NOTE — ED Provider Triage Note (Cosign Needed)
Emergency Medicine Provider Triage Evaluation Note  Diane Hunter , a 52 y.o. female  was evaluated in triage.  Pt complains of hypertension.  She has a history of the same.  She has been off of lisinopril for more than 6 months.  She went to her doctor today to get rechecked and restarted on her medications.  Blood pressures were found to be greater than [id number] systolic.  She was given 0.1 mg of clonidine without improvement.  She was then referred to the emergency room.  She denies any strokelike symptoms, vision loss, chest pain, shortness of breath.  She also reports increased urinary frequency and would like to be checked for UTI.  Review of Systems  Positive: Occasional headache Negative: Weakness, slurred speech, vision loss, chest pain or shortness of breath  Physical Exam  BP (!) 242/132   Pulse 90   Temp 97.9 F (36.6 C) (Oral)   Resp 18   Ht 5\' 3"  (1.6 m)   Wt 77.6 kg   SpO2 100%   BMI 30.29 kg/m  Gen:   Awake, no distress   Resp:  Normal effort  MSK:   Moves extremities without difficulty  Other:  Heart regular rhythm, no murmur  Medical Decision Making  Medically screening exam initiated at 5:19 PM.  Appropriate orders placed.  Diane Hunter was informed that the remainder of the evaluation will be completed by another provider, this initial triage assessment does not replace that evaluation, and the importance of remaining in the ED until their evaluation is complete.     Carlisle Cater, PA-C 10/03/22 1720

## 2022-10-03 NOTE — ED Provider Notes (Signed)
[place] EMERGENCY DEPARTMENT AT [facility] Provider Note   CSN: [id number] Arrival date & time: 10/03/22  1650     History  Chief Complaint  Patient presents with   Hypertension    Diane Hunter is a 52 y.o. female.  With PMH of hypertension sent by PCP for concern for hypertensive urgency.  Patient has history of chronic hypertension.  She said she was previously on lisinopril but did not like the way it made her feel so she stopped taking it.  She went to see her PCP today for evaluation for chronic high blood pressure.  She was sent here for concern for hypertensive urgency.  She is currently asymptomatic.  She denies any chest pain, no shortness of breath, no palpitations, no severe headache, no shortness of breath, no focal weakness numbness or tingling.  No visual changes.   Hypertension       Home Medications Prior to Admission medications   Medication Sig Start Date End Date Taking? Authorizing Provider  amLODipine (NORVASC) 5 MG tablet Take 1 tablet (5 mg total) by mouth daily. 10/03/22  Yes Elgie Congo, MD  hydrochlorothiazide (HYDRODIURIL) 25 MG tablet Take 0.5 tablets (12.5 mg total) by mouth daily. 10/03/22  Yes Elgie Congo, MD  aspirin-acetaminophen-caffeine (EXCEDRIN MIGRAINE) [phone] MG per tablet Take 2 tablets by mouth every 6 (six) hours as needed. migraine    [provider]  benzonatate (TESSALON) 100 MG capsule Take 1 capsule (100 mg total) by mouth every 8 (eight) hours. 06/20/20   Khatri, Hina, PA-C  fluticasone (FLONASE) 50 MCG/ACT nasal spray Place 1 spray into both nostrils daily. 06/20/20   Khatri, Hina, PA-C  hydrocortisone 2.5 % cream Apply topically 2 (two) times daily. 03/03/15   Tysinger, Camelia Eng, PA-C  ibuprofen (ADVIL,MOTRIN) 800 MG tablet Take 1 tablet (800 mg total) by mouth 3 (three) times daily. 08/04/16   Domenic Moras, PA-C  lisinopril-hydrochlorothiazide (ZESTORETIC) 20-12.5 MG tablet Take 1 tablet by mouth  daily. 08/26/17   Milton Ferguson, MD  lisinopril-hydrochlorothiazide (ZESTORETIC) 20-12.5 MG tablet Take 1 tablet by mouth daily. 04/21/19 06/20/19  Joy, Shawn C, PA-C  traMADol (ULTRAM) 50 MG tablet Take 1 tablet (50 mg total) by mouth every 6 (six) hours as needed. 08/26/17   Milton Ferguson, MD      Allergies    Pork-derived products    Review of Systems   Review of Systems  Physical Exam Updated Vital Signs BP (!) 196/106   Pulse 75   Temp 98.1 F (36.7 C) (Oral)   Resp 14   Ht 5\' 3"  (1.6 m)   Wt 77.6 kg   SpO2 100%   BMI 30.29 kg/m  Physical Exam Constitutional: Alert and oriented. Well appearing and in no distress. Eyes: Conjunctivae are normal. ENT      Head: Normocephalic and atraumatic. Cardiovascular: S1, S2,  Normal and symmetric distal pulses are present in all extremities.Warm and well perfused. Respiratory: Normal respiratory effort. Breath sounds are normal.  O2 sat 100 on RA Gastrointestinal: Soft and nontender.  Musculoskeletal: Normal range of motion in all extremities.      Right lower leg: No tenderness or edema.      Left lower leg: No tenderness or edema. Neurologic: Normal speech and language.  CN II through XII grossly intact.  5 out of 5 strength bilateral upper and lower extremities.  Sensation grossly intact.  Steady gait.  No gross focal neurologic deficits are appreciated. Skin: Skin is warm, dry  and intact. No rash noted. Psychiatric: Mood and affect are normal. Speech and behavior are normal.  ED Results / Procedures / Treatments   Labs (all labs ordered are listed, but only abnormal results are displayed) Labs Reviewed  URINALYSIS, ROUTINE W REFLEX MICROSCOPIC - Abnormal; Notable for the following components:      Result Value   Color, Urine COLORLESS (*)    All other components within normal limits  BASIC METABOLIC PANEL - Abnormal; Notable for the following components:   Sodium 134 (*)    Potassium 3.2 (*)    Glucose, Bld 102 (*)    Calcium  8.8 (*)    All other components within normal limits  CBC WITH DIFFERENTIAL/PLATELET  TROPONIN I (HIGH SENSITIVITY)  TROPONIN I (HIGH SENSITIVITY)    EKG EKG Interpretation  Date/Time:  Monday October 03 2022 19:47:46 EDT Ventricular Rate:  68 PR Interval:  141 QRS Duration: 109 QT Interval:  545 QTC Calculation: 580 R Axis:   -1 Text Interpretation: Sinus rhythm Probable left ventricular hypertrophy Anterior Q waves, possibly due to LVH Abnormal T, consider ischemia, diffuse leads Prolonged QT interval Confirmed by Georgina Snell [phone]) on 10/03/2022 8:12:26 PM  Radiology DG Chest 2 View  Result Date: 10/03/2022 CLINICAL DATA:  Hypertension EXAM: CHEST - 2 VIEW COMPARISON:  Chest radiograph dated 06/20/2020 FINDINGS: Normal lung volumes. No focal consolidations. No pleural effusion or pneumothorax. The heart size and mediastinal contours are within normal limits. The visualized skeletal structures are unremarkable. IMPRESSION: No active cardiopulmonary disease. Electronically Signed   By: Darrin Nipper M.D.   On: 10/03/2022 17:52    Procedures Procedures  Remain on constant cardiac monitoring, normal sinus rhythm with normal rates.  Medications Ordered in ED Medications  amLODipine (NORVASC) tablet 5 mg (5 mg Oral Given 10/03/22 1749)  hydrochlorothiazide (HYDRODIURIL) tablet 12.5 mg (12.5 mg Oral Given 10/03/22 1749)  potassium chloride SA (KLOR-CON M) CR tablet 40 mEq (40 mEq Oral Given 10/03/22 1847)    ED Course/ Medical Decision Making/ A&P Clinical Course as of 10/03/22 2111  Mon Oct 03, 2022  2006 Spoke with Dr. Debara Pickett cardiology who reviewed patient's EKG.  He does note that she has a previous EKG from 2016 which looks slightly similar however the T waves in the lateral leads are more progressed than prior.  He does note that patient has changes on EKG consistent with chronically uncontrolled blood pressure.  He would recommend monitoring overnight repeat EKG in the morning  continue blood pressure control.  MAP is already decreased 40 points with just oral meds.  I am holding off from further decreasing at this time. [VB]  2055 I spoke with patient who is still asymptomatic.  I did recommend admission to the hospital as discussed with cardiology for observation, blood pressure control, probable echocardiogram.  She is choosing to leave AMA.   Date: 10/03/2022 Patient: Diane Hunter Attending Provider: Edmonia Caprio or her authorized caregiver has made the decision for the patient to leave the emergency department against the advice of @ATTPROV @.  She or her authorized caregiver has been informed and understands the inherent risks, including death.  She or her authorized caregiver has decided to accept the responsibility for this decision. Diane Hunter and all necessary parties have been advised that she may return for further evaluation or treatment. Her condition at time of discharge was Stable.  Diane Hunter had current vital signs as follows:  @V @   Diane Hunter or her authorized caregiver  has signed the Leaving Against Medical Advice form prior to leaving the department.  Elgie Congo 10/03/2022  Will still discharge with amlodipine and HCTZ. Placed cardiology referral.     [VB]    Clinical Course User Index [VB] Elgie Congo, MD                             Medical Decision Making Diane Hunter is a 52 y.o. female.  With PMH of hypertension sent by PCP for concern for hypertensive urgency.   Patient asymptomatic.  Had EKG performed with abnormal T waves in the inferior lateral leads.  More progressed than prior.  However without chest pain.  Troponins were reassuring 6 and repeat 7.  I have no concern for hypertensive emergency.  No stroke symptoms.  No chest pain.  Doubt active ACS.  No pulmonary edema on chest x-ray which I personally reviewed.  Creatinine within normal limits.    However, suspect HTN urgency. Spoke with Dr. Debara Pickett  cardiology who reviewed patient's EKG.  He does note that she has a previous EKG from 2016 which looks slightly similar however the T waves in the lateral leads are more progressed than prior.  He does note that patient has changes on EKG consistent with chronically uncontrolled blood pressure.  He would recommend monitoring overnight repeat EKG in the morning continue blood pressure control.  MAP is already decreased 40 points with just oral meds.  I am holding off from further decreasing at this time. [VB] I spoke with patient who is still asymptomatic.  I did recommend admission to the hospital as discussed with cardiology for observation, blood pressure control, probable echocardiogram.  She is choosing to leave AMA.   Date: 10/03/2022 Patient: Diane Hunter Attending Provider: Edmonia Caprio or her authorized caregiver has made the decision for the patient to leave the emergency department against the advice of Dr Nechama Guard.  She or her authorized caregiver has been informed and understands the inherent risks, including death.  She or her authorized caregiver has decided to accept the responsibility for this decision. Diane Hunter and all necessary parties have been advised that she may return for further evaluation or treatment. Her condition at time of discharge was Stable.  Diane Hunter had current vital signs as follows:  @V @   Diane Hunter or her authorized caregiver has signed the [place] Advice form prior to leaving the department.  Elgie Congo 10/03/2022  Will still discharge with amlodipine and HCTZ. Placed cardiology referral.     [VB]   Amount and/or Complexity of Data Reviewed Labs: ordered. Radiology: ordered.  Risk Prescription drug management.    Final Clinical Impression(s) / ED Diagnoses Final diagnoses:  Hypertensive urgency  Hypokalemia    Rx / DC Orders ED Discharge Orders          Ordered    Ambulatory referral to Cardiology        Comments: If you have not heard from the Cardiology office within the next 72 hours please call [phone].   10/03/22 2057    amLODipine (NORVASC) 5 MG tablet  Daily        10/03/22 2057    hydrochlorothiazide (HYDRODIURIL) 25 MG tablet  Daily        10/03/22 2057              Elgie Congo, MD 10/03/22 2111

## 2024-05-29 NOTE — Progress Notes (Deleted)
    [facility] [address] [place] [address] Phone: [phone]   Assessment and Plan:     ***    Pertinent previous records reviewed include ***   Follow Up: ***     Subjective:   I, Diane Hunter, am serving as a neurosurgeon for Doctor Morene Mace  Chief Complaint: stiff and sore legs bilat   HPI:   05/30/2024 Patient is a 53 year old female with stiff and sore legs bilat. Patient states   Relevant Historical Information: ***  Additional pertinent review of systems negative.   Current Outpatient Medications:    amLODipine  (NORVASC ) 5 MG tablet, Take 1 tablet (5 mg total) by mouth daily., Disp: 60 tablet, Rfl: 0   aspirin-acetaminophen -caffeine (EXCEDRIN MIGRAINE) 250-250-65 MG per tablet, Take 2 tablets by mouth every 6 (six) hours as needed. migraine, Disp: , Rfl:    benzonatate  (TESSALON ) 100 MG capsule, Take 1 capsule (100 mg total) by mouth every 8 (eight) hours., Disp: 21 capsule, Rfl: 0   fluticasone  (FLONASE ) 50 MCG/ACT nasal spray, Place 1 spray into both nostrils daily., Disp: 16 g, Rfl: 2   hydrochlorothiazide  (HYDRODIURIL ) 25 MG tablet, Take 0.5 tablets (12.5 mg total) by mouth daily., Disp: 60 tablet, Rfl: 0   hydrocortisone  2.5 % cream, Apply topically 2 (two) times daily., Disp: 30 g, Rfl: 0   ibuprofen  (ADVIL ,MOTRIN ) 800 MG tablet, Take 1 tablet (800 mg total) by mouth 3 (three) times daily., Disp: 21 tablet, Rfl: 0   lisinopril -hydrochlorothiazide  (ZESTORETIC ) 20-12.5 MG tablet, Take 1 tablet by mouth daily., Disp: 30 tablet, Rfl: 1   lisinopril -hydrochlorothiazide  (ZESTORETIC ) 20-12.5 MG tablet, Take 1 tablet by mouth daily., Disp: 30 tablet, Rfl: 1   traMADol  (ULTRAM ) 50 MG tablet, Take 1 tablet (50 mg total) by mouth every 6 (six) hours as needed., Disp: 15 tablet, Rfl: 0   Objective:     There were no vitals filed for this visit.    There is no height or weight on file to calculate BMI.     Physical Exam:    ***   Electronically signed by:  Odis Mace [facility] 7:34 AM 05/29/24

## 2024-05-30 ENCOUNTER — Ambulatory Visit: Admitting: Sports Medicine
